# Patient Record
Sex: Female | Born: 1990 | Race: Black or African American | Hispanic: No | Marital: Single | Smoking: Former smoker
Health system: Southern US, Community
[De-identification: ages and names within clinical notes are randomized; demographics above are authoritative.]

## PROBLEM LIST (undated history)

## (undated) DIAGNOSIS — F909 Attention-deficit hyperactivity disorder, unspecified type: Secondary | ICD-10-CM

## (undated) DIAGNOSIS — A549 Gonococcal infection, unspecified: Secondary | ICD-10-CM

## (undated) HISTORY — PX: NO PAST SURGERIES: SHX2092

---

## 2018-02-04 ENCOUNTER — Other Ambulatory Visit: Payer: Self-pay

## 2018-02-04 ENCOUNTER — Emergency Department
Admission: EM | Admit: 2018-02-04 | Discharge: 2018-02-04 | Disposition: A | Discharge status: Home / Self Care | Payer: Self-pay | Attending: Emergency Medicine | Admitting: Emergency Medicine

## 2018-02-04 ENCOUNTER — Encounter: Payer: Self-pay | Admitting: Emergency Medicine

## 2018-02-04 DIAGNOSIS — K089 Disorder of teeth and supporting structures, unspecified: Secondary | ICD-10-CM

## 2018-02-04 DIAGNOSIS — G8929 Other chronic pain: Secondary | ICD-10-CM | POA: Insufficient documentation

## 2018-02-04 DIAGNOSIS — K0889 Other specified disorders of teeth and supporting structures: Secondary | ICD-10-CM | POA: Insufficient documentation

## 2018-02-04 DIAGNOSIS — F17210 Nicotine dependence, cigarettes, uncomplicated: Secondary | ICD-10-CM | POA: Insufficient documentation

## 2018-02-04 DIAGNOSIS — M542 Cervicalgia: Secondary | ICD-10-CM | POA: Insufficient documentation

## 2018-02-04 DIAGNOSIS — R1030 Lower abdominal pain, unspecified: Secondary | ICD-10-CM | POA: Insufficient documentation

## 2018-02-04 LAB — URINALYSIS, COMPLETE (UACMP) WITH MICROSCOPIC
Bacteria, UA: NONE SEEN
Bilirubin Urine: NEGATIVE
GLUCOSE, UA: NEGATIVE mg/dL
HGB URINE DIPSTICK: NEGATIVE
Ketones, ur: NEGATIVE mg/dL
Leukocytes, UA: NEGATIVE
NITRITE: NEGATIVE
PROTEIN: NEGATIVE mg/dL
Specific Gravity, Urine: 1.02 (ref 1.005–1.030)
pH: 6 (ref 5.0–8.0)

## 2018-02-04 LAB — POCT PREGNANCY, URINE: Preg Test, Ur: NEGATIVE

## 2018-02-04 MED ORDER — CEPHALEXIN 500 MG PO CAPS: 500.0000 mg | ORAL_CAPSULE | Freq: Four times a day (QID) | ORAL | 0 refills | Status: AC

## 2018-02-04 NOTE — ED Provider Notes (Signed)
[facility] Emergency Department Provider Note  ____________________________________________  Time seen: Approximately 4:53 PM  I have reviewed the triage vital signs and the nursing notes.   HISTORY  Chief Complaint Dental Pain and Neck Pain    HPI Jade Allen is a 27 y.o. female presents to the emergency department with multiple concerns including dental pain, neck pain and some suprapubic discomfort.  Patient is primarily here because she is unable to conceive.  Patient reports that she has been trying to conceive for the past 3 months.  Patient has been using a "semen retention lubrication" and has noticed suprapubic discomfort since using aforementioned product.  She denies dysuria, hematuria and increased urinary frequency.  Patient reports dental pain that is chronic in nature and patient is unable to seek care due to a lack of dental insurance.  Patient also reports other nonspecific musculoskeletal complaints due to her job at [employer].  No alleviating measures of been attempted   History reviewed. No pertinent past medical history.  There are no active problems to display for this patient.   History reviewed. No pertinent surgical history.  Prior to Admission medications   Medication Sig Start Date End Date Taking? Authorizing Provider  cephALEXin (KEFLEX) 500 MG capsule Take 1 capsule (500 mg total) by mouth 4 (four) times daily for 10 days. 02/04/18 02/14/18  Lannie Fields, PA-C    Allergies Patient has no known allergies.  No family history on file.  Social History Social History   Tobacco Use  . Smoking status: Current Every Day Smoker    Packs/day: 1.00    Types: Cigarettes  . Smokeless tobacco: Never Used  Substance Use Topics  . Alcohol use: Not on file  . Drug use: Not on file     Review of Systems  Constitutional: No fever/chills Eyes: No visual changes. No discharge ENT: No upper respiratory complaints. Cardiovascular: no  chest pain. Respiratory: no cough. No SOB. Gastrointestinal: No abdominal pain.  No nausea, no vomiting.  No diarrhea.  No constipation. Genitourinary: Negative for dysuria. No hematuria Musculoskeletal: See HPI Skin: Negative for rash, abrasions, lacerations, ecchymosis. Neurological: Negative for headaches, focal weakness or numbness.   ____________________________________________   PHYSICAL EXAM:  VITAL SIGNS: ED Triage Vitals  Enc Vitals Group     BP 02/04/18 1648 135/82     Pulse Rate 02/04/18 1648 78     Resp 02/04/18 1648 16     Temp --      Temp src --      SpO2 02/04/18 1648 99 %     Weight 02/04/18 1456 234 lb (106.1 kg)     Height 02/04/18 1456 5\' 3"  (1.6 m)     Head Circumference --      Peak Flow --      Pain Score 02/04/18 1648 7     Pain Loc --      Pain Edu? --      Excl. in [place]? --      Constitutional: Alert and oriented. Well appearing and in no acute distress. Eyes: Conjunctivae are normal. PERRL. EOMI. Head: Atraumatic. ENT:      Ears: TMs are pearly      Nose: No congestion/rhinnorhea.      Mouth/Throat: Mucous membranes are moist.  Multiple dental caries visualized. Neck: No stridor.  No cervical spine tenderness to palpation. Hematological/Lymphatic/Immunilogical: No cervical lymphadenopathy. Cardiovascular: Normal rate, regular rhythm. Normal S1 and S2.  Good peripheral circulation. Respiratory: Normal respiratory effort  without tachypnea or retractions. Lungs CTAB. Good air entry to the bases with no decreased or absent breath sounds. Multiple dental caries noted.  Gastrointestinal: Bowel sounds 4 quadrants. Soft and nontender to palpation. No guarding or rigidity. No palpable masses. No distention. No CVA tenderness. Musculoskeletal: Full range of motion to all extremities. No gross deformities appreciated. Neurologic:  Normal speech and language. No gross focal neurologic deficits are appreciated.  Skin:  Skin is warm, dry and intact. No rash  noted. Psychiatric: Mood and affect are normal. Speech and behavior are normal. Patient exhibits appropriate insight and judgement.   ____________________________________________   LABS (all labs ordered are listed, but only abnormal results are displayed)  Labs Reviewed  URINALYSIS, COMPLETE (UACMP) WITH MICROSCOPIC - Abnormal; Notable for the following components:      Result Value   Color, Urine YELLOW (*)    APPearance CLEAR (*)    Squamous Epithelial / LPF 0-5 (*)    All other components within normal limits  POC URINE PREG, ED  POCT PREGNANCY, URINE   ____________________________________________  EKG   ____________________________________________  RADIOLOGY   No results found.  ____________________________________________    PROCEDURES  Procedure(s) performed:    Procedures    Medications - No data to display   ____________________________________________   INITIAL IMPRESSION / ASSESSMENT AND PLAN / ED COURSE  Pertinent labs & imaging results that were available during my care of the patient were reviewed by me and considered in my medical decision making (see chart for details).  Review of the [place] CSRS was performed in accordance of the [place] prior to dispensing any controlled drugs.    Assessment and plan Chronic dental pain Suprapubic discomfort Neck pain Patient presents to the emergency department with multiple concerns.  Patient's primary concern is seeking medical advice in regards to achieving conception.  Patient was advised to quit smoking and patient education regarding tobacco cessation was given.  Patient was also advised to increase her physical activity, such as daily walking in the evenings.  Patient was advised to discontinue lubrication product discussed in HPI.  Patient education regarding ovulation was given.  Tylenol was recommended for discomfort and patient was advised to start taking prenatal vitamins daily.  All patient  questions were answered.   ____________________________________________  FINAL CLINICAL IMPRESSION(S) / ED DIAGNOSES  Final diagnoses:  Chronic dental pain      NEW MEDICATIONS STARTED DURING THIS VISIT:  ED Discharge Orders        Ordered    cephALEXin (KEFLEX) 500 MG capsule  4 times daily     02/04/18 1638          This chart was dictated using voice recognition software/Dragon. Despite best efforts to proofread, errors can occur which can change the meaning. Any change was purely unintentional.    Lannie Fields, PA-C 02/04/18 1700    Delman Kitten, MD 02/07/18 0100

## 2018-02-04 NOTE — ED Triage Notes (Addendum)
Pt arrived via POV from work, pt states she has been having dental pain and neck pain for several weeks, pt also states she has been trying to have a baby, states last menstral cycle was 3/19.  Pt denies any vaginal discharge-states [facility] just [facility] about a [facility] ago for pelvic exam.   Pt states she feels pressure around suprapubic area that started during period.  Pt states she has had unprotected sex.

## 2018-02-04 NOTE — Discharge Instructions (Signed)
OPTIONS FOR DENTAL FOLLOW UP CARE ° °[place] Department of Health and Human Services - Local Safety Net Dental Clinics °http://www.ncdhhs.gov/dph/oralhealth/services/safetynetclinics.htm °  °[facility] (336-562-3123) ° °[facility] [place] (919-933-9087) ° °[facility] ([phone] ext [phone]) ° °[facility] ([phone]) ° °[facility] ([phone]) °This clinic caters to the indigent population and is on a lottery system. °Location: °[facility], [address], [place] °Clinic Hours: °Wednesdays from 6pm - 9pm, patients seen by a lottery system. °For dates, call or go to [email or web address].[facility].edu/[facility]/patients/Dental-[facility] °Services: °Cleanings, fillings and simple extractions. °Payment Options: °DENTAL WORK IS FREE OF CHARGE. Bring proof of income or support. °Best way to get seen: °Arrive at 5:15 pm - this is a lottery, NOT first come/first serve, so arriving earlier will not increase your chances of being seen. °  °  °[facility] °[phone] °Select option 1 for emergencies °  °Location: °[facility], [address], [place] °Clinic Hours: °No walk-ins accepted - call the day before to schedule an appointment. °Check in times are 9:30 am and 1:30 pm. °Services: °Simple extractions, temporary fillings, pulpectomy/pulp debridement, uncomplicated abscess drainage. °Payment Options: °PAYMENT IS DUE AT THE TIME OF SERVICE.  Fee is usually $100-200, additional surgical procedures (e.g. abscess drainage) may be extra. °Cash, checks, Visa/MasterCard accepted.  Can file Medicaid if patient is covered for dental - patient should call case worker to check. °No discount for [facility] patients. °Best way to get seen: °MUST call the day before and get onto the schedule. Can usually be seen the next 1-2 days. No walk-ins accepted. °  °  °[facility] °[phone] °   °Location: °[facility], [address], [place] °Clinic Hours: °M, W, Th, F 8am or 1:30pm, Tues 9a or 1:30 - first come/first served. °Services: °Simple extractions, temporary fillings, uncomplicated abscess drainage.  You do not need to be an [place] resident. °Payment Options: °PAYMENT IS DUE AT THE TIME OF SERVICE. °Dental insurance, otherwise sliding scale - bring proof of income or support. °Depending on income and treatment needed, cost is usually $50-200. °Best way to get seen: °Arrive early as it is first come/first served. °  °  °[facility] °[phone] °  °Location: °[address] °Clinic Hours: °Mon-Thu 8a-5p °Services: °Most basic dental services including extractions and fillings. °Payment Options: °PAYMENT IS DUE AT THE TIME OF SERVICE. °Sliding scale, up to 50% off - bring proof if income or support. °Medicaid with dental option accepted. °Best way to get seen: °Call to schedule an appointment, can usually be seen within 2 weeks OR they will try to see walk-ins - show up at 8a or 2p (you may have to wait). °  °  °[facility] °919-245-2435 °[place] RESIDENTS ONLY °  °Location: °[facility], [address], [place],  [address] °Clinic Hours: By appointment only. °Monday - Thursday 8am-5pm, Friday 8am-12pm °Services: Cleanings, fillings, extractions. °Payment Options: °PAYMENT IS DUE AT THE TIME OF SERVICE. °Cash, Visa or MasterCard. Sliding scale - $30 minimum per service. °Best way to get seen: °Come in to office, complete packet and make an appointment - need proof of income °or support monies for each household member and proof of [place] residence. °Usually takes about a month to get in. °  °  °[facility] °[phone] °  °Location: °[address]   [place] °Clinic Hours: Walk-in Urgent Care Dental Services are offered Monday-Friday  mornings only. °The numbers of emergencies accepted daily is limited to the number of °providers available. °Maximum 15 - Mondays, Wednesdays & Thursdays °Maximum 10 - Tuesdays & Fridays °Services: °You do not need to be a [place] resident to be seen for a dental emergency. °Emergencies are defined as pain, swelling, abnormal bleeding, or dental trauma. Walkins will receive x-rays if needed. °NOTE: Dental cleaning is not an emergency. °Payment Options: °PAYMENT IS DUE AT THE TIME OF SERVICE. °Minimum co-pay is $40.00 for uninsured patients. °Minimum co-pay is $3.00 for Medicaid with dental coverage. °Dental Insurance is accepted and must be presented at time of visit. °Medicare does not cover dental. °Forms of payment: Cash, credit card, checks. °Best way to get seen: °If not previously registered with the clinic, walk-in dental registration begins at 7:15 am and is on a first come/first serve basis. °If previously registered with the clinic, call to make an appointment. °  °  °[facility] °[phone] °[place] RESIDENTS ONLY °  °Location: °[address], [place], [place] °Clinic Hours: °Mon-Thu 10a-2p °Services: Extractions only! °Payment Options: °FREE (donations accepted) - bring proof of income or support °Best way to get seen: °Call and schedule an appointment OR come at 8am on the 1st Monday of every month (except for holidays) when it is first come/first served. °  °  °[facility] °[phone] °  °Location: °[address], [place] °Clinic Hours: °Friday mornings °Services, Payment Options, Best way to get seen: °Call for info °

## 2018-04-27 ENCOUNTER — Emergency Department
Admission: EM | Admit: 2018-04-27 | Discharge: 2018-04-27 | Disposition: A | Discharge status: Home / Self Care | Payer: Self-pay | Attending: Emergency Medicine | Admitting: Emergency Medicine

## 2018-04-27 DIAGNOSIS — S161XXA Strain of muscle, fascia and tendon at neck level, initial encounter: Secondary | ICD-10-CM | POA: Insufficient documentation

## 2018-04-27 DIAGNOSIS — F17210 Nicotine dependence, cigarettes, uncomplicated: Secondary | ICD-10-CM | POA: Insufficient documentation

## 2018-04-27 DIAGNOSIS — Y990 Civilian activity done for income or pay: Secondary | ICD-10-CM | POA: Insufficient documentation

## 2018-04-27 DIAGNOSIS — X500XXA Overexertion from strenuous movement or load, initial encounter: Secondary | ICD-10-CM | POA: Insufficient documentation

## 2018-04-27 DIAGNOSIS — S46911A Strain of unspecified muscle, fascia and tendon at shoulder and upper arm level, right arm, initial encounter: Secondary | ICD-10-CM | POA: Insufficient documentation

## 2018-04-27 DIAGNOSIS — T148XXA Other injury of unspecified body region, initial encounter: Secondary | ICD-10-CM

## 2018-04-27 DIAGNOSIS — Y929 Unspecified place or not applicable: Secondary | ICD-10-CM | POA: Insufficient documentation

## 2018-04-27 DIAGNOSIS — Y9389 Activity, other specified: Secondary | ICD-10-CM | POA: Insufficient documentation

## 2018-04-27 MED ORDER — CYCLOBENZAPRINE HCL 10 MG PO TABS
5.0000 mg | ORAL_TABLET | Freq: Once | ORAL | Status: AC
  Administered 2018-04-27: 5 mg via ORAL
  Filled 2018-04-27: qty 1

## 2018-04-27 MED ORDER — CYCLOBENZAPRINE HCL 5 MG PO TABS: 5.0000 mg | ORAL_TABLET | Freq: Three times a day (TID) | ORAL | 0 refills | Status: AC | PRN

## 2018-04-27 MED ORDER — KETOROLAC TROMETHAMINE 10 MG PO TABS: 10.0000 mg | ORAL_TABLET | Freq: Four times a day (QID) | ORAL | 0 refills | Status: DC | PRN

## 2018-04-27 MED ORDER — KETOROLAC TROMETHAMINE 30 MG/ML IJ SOLN
30.0000 mg | Freq: Once | INTRAMUSCULAR | Status: AC
  Administered 2018-04-27: 30 mg via INTRAMUSCULAR
  Filled 2018-04-27: qty 1

## 2018-04-27 NOTE — ED Triage Notes (Signed)
Patient c/o neck pain and upper back pain. Patient reports hx of the same. Patient reports she has been prescribed lidocaine patches in the past with no relief.

## 2018-04-27 NOTE — ED Provider Notes (Signed)
[facility] Emergency Department Provider Note  ____________________________________________  Time seen: Approximately 8:42 PM  I have reviewed the triage vital signs and the nursing notes.   HISTORY  Chief Complaint Neck Pain and Back Pain    HPI Jade Allen is a 27 y.o. female that presents to the emergency department for evaluation of right neck and shoulder pain for 2 days.  Pain is worse with rotating neck and moving right shoulder.  She lifts boxes for work.  No trauma.  She has been evaluated for this in the past and has been given a Lidoderm patch which does not help.  No headache, SOB, CP, numbness, tingling.  History reviewed. No pertinent past medical history.  There are no active problems to display for this patient.   History reviewed. No pertinent surgical history.  Prior to Admission medications   Medication Sig Start Date End Date Taking? Authorizing Provider  cyclobenzaprine (FLEXERIL) 5 MG tablet Take 1 tablet (5 mg total) by mouth 3 (three) times daily as needed for up to 7 days for muscle spasms. 04/27/18 05/04/18  Laban Emperor, PA-C  ketorolac (TORADOL) 10 MG tablet Take 1 tablet (10 mg total) by mouth every 6 (six) hours as needed. 04/27/18   Laban Emperor, PA-C    Allergies Patient has no known allergies.  No family history on file.  Social History Social History   Tobacco Use  . Smoking status: Current Every Day Smoker    Packs/day: 1.00    Types: Cigarettes  . Smokeless tobacco: Never Used  Substance Use Topics  . Alcohol use: Yes  . Drug use: Not on file     Review of Systems  Constitutional: No fever/chills ENT: No upper respiratory complaints. Cardiovascular: No chest pain. Respiratory: No SOB. Gastrointestinal: No abdominal pain.  No nausea, no vomiting.  Musculoskeletal: Positive for neck and shoulder pain.  Skin: Negative for rash, abrasions, lacerations, ecchymosis. Neurological: Negative for headaches,  numbness or tingling   ____________________________________________   PHYSICAL EXAM:  VITAL SIGNS: ED Triage Vitals  Enc Vitals Group     BP 04/27/18 1938 125/79     Pulse Rate 04/27/18 1938 63     Resp 04/27/18 1938 18     Temp 04/27/18 1938 98.4 F (36.9 C)     Temp Source 04/27/18 1938 Oral     SpO2 04/27/18 1938 99 %     Weight 04/27/18 1937 230 lb (104.3 kg)     Height 04/27/18 1937 5\' 3"  (1.6 m)     Head Circumference --      Peak Flow --      Pain Score 04/27/18 1937 9     Pain Loc --      Pain Edu? --      Excl. in [place]? --      Constitutional: Alert and oriented. Well appearing and in no acute distress. Eyes: Conjunctivae are normal. PERRL. EOMI. Head: Atraumatic. ENT:      Ears:      Nose: No congestion/rhinnorhea.      Mouth/Throat: Mucous membranes are moist.  Neck: No stridor. No cervical spine tenderness to palpation.  Full range of motion of neck.  Cardiovascular: Normal rate, regular rhythm.  Good peripheral circulation. Symmetric radial pulses. Respiratory: Normal respiratory effort without tachypnea or retractions. Lungs CTAB. Good air entry to the bases with no decreased or absent breath sounds. Musculoskeletal: Full range of motion to all extremities. No gross deformities appreciated.  Tenderness to palpation over right  trapezius muscle.  Strength and sensation equal in upper extremities bilaterally.  Neurologic:  Normal speech and language. No gross focal neurologic deficits are appreciated.  Skin:  Skin is warm, dry and intact. No rash noted. Psychiatric: Mood and affect are normal. Speech and behavior are normal. Patient exhibits appropriate insight and judgement.   ____________________________________________   LABS (all labs ordered are listed, but only abnormal results are displayed)  Labs Reviewed - No data to display ____________________________________________  EKG   ____________________________________________  RADIOLOGY   No  results found.  ____________________________________________    PROCEDURES  Procedure(s) performed:    Procedures    Medications  ketorolac (TORADOL) 30 MG/ML injection 30 mg (30 mg Intramuscular Given 04/27/18 2055)  cyclobenzaprine (FLEXERIL) tablet 5 mg (5 mg Oral Given 04/27/18 2056)     ____________________________________________   INITIAL IMPRESSION / ASSESSMENT AND PLAN / ED COURSE  Pertinent labs & imaging results that were available during my care of the patient were reviewed by me and considered in my medical decision making (see chart for details).  Review of the [place] CSRS was performed in accordance of the [place] prior to dispensing any controlled drugs.   Patient's diagnosis is consistent with muscle strain.  Vital signs and exam are reassuring.  IM Toradol and oral Flexeril were given in ED.  Patient will be discharged home with prescriptions for toradol and flexeril. Patient is to follow up with PCP as directed. Patient is given ED precautions to return to the ED for any worsening or new symptoms.     ____________________________________________  FINAL CLINICAL IMPRESSION(S) / ED DIAGNOSES  Final diagnoses:  Muscle strain      NEW MEDICATIONS STARTED DURING THIS VISIT:  ED Discharge Orders        Ordered    cyclobenzaprine (FLEXERIL) 5 MG tablet  3 times daily PRN     04/27/18 2117    ketorolac (TORADOL) 10 MG tablet  Every 6 hours PRN     04/27/18 2117          This chart was dictated using voice recognition software/Dragon. Despite best efforts to proofread, errors can occur which can change the meaning. Any change was purely unintentional.    Laban Emperor, PA-C 04/27/18 2214    Arta Silence, MD 04/28/18 [phone]

## 2018-04-27 NOTE — ED Notes (Signed)
Pt reports having left side neck, shoulder and upper back pain (only on the left side) since yesterday. Pt reports that it is tender to touch and symptoms are aggravated during LUE movement and when she turns her head from side to side. No neck stiffness was noted, pt denies headache or throat pain. Pt does not show any signs of respiratory distress. We will continue to monitor the pt.

## 2018-05-14 ENCOUNTER — Emergency Department
Admission: EM | Admit: 2018-05-14 | Discharge: 2018-05-14 | Disposition: A | Discharge status: Home / Self Care | Payer: Medicaid Other | Attending: Emergency Medicine | Admitting: Emergency Medicine

## 2018-05-14 ENCOUNTER — Other Ambulatory Visit: Payer: Self-pay

## 2018-05-14 ENCOUNTER — Encounter: Payer: Self-pay | Admitting: Emergency Medicine

## 2018-05-14 DIAGNOSIS — Z79899 Other long term (current) drug therapy: Secondary | ICD-10-CM | POA: Insufficient documentation

## 2018-05-14 DIAGNOSIS — F17210 Nicotine dependence, cigarettes, uncomplicated: Secondary | ICD-10-CM | POA: Insufficient documentation

## 2018-05-14 DIAGNOSIS — K0889 Other specified disorders of teeth and supporting structures: Secondary | ICD-10-CM | POA: Insufficient documentation

## 2018-05-14 MED ORDER — AMOXICILLIN 500 MG PO CAPS: 500.0000 mg | ORAL_CAPSULE | Freq: Three times a day (TID) | ORAL | 0 refills | Status: DC

## 2018-05-14 MED ORDER — LIDOCAINE VISCOUS HCL 2 % MT SOLN: 10.0000 mL | OROMUCOSAL | 0 refills | Status: DC | PRN

## 2018-05-14 MED ORDER — TRAMADOL HCL 50 MG PO TABS
50.0000 mg | ORAL_TABLET | Freq: Once | ORAL | Status: AC
  Administered 2018-05-14: 50 mg via ORAL
  Filled 2018-05-14: qty 1

## 2018-05-14 NOTE — ED Notes (Signed)
Right lower wisdom tooth only partially out. Swollen, tender.

## 2018-05-14 NOTE — ED Triage Notes (Addendum)
Pt c/o right side dental pain 2-3 days using ibuprofen, tylenol and oragel with no relief.

## 2018-05-14 NOTE — ED Provider Notes (Signed)
[facility] Emergency Department Provider Note  ____________________________________________  Time seen: Approximately 8:02 AM  I have reviewed the triage vital signs and the nursing notes.   HISTORY  Chief Complaint Dental Pain    HPI Jade Allen is a 27 y.o. female presents emergency department for evaluation of right bottom dental pain for 3 days.  Patient states that this tooth has bothered her on and off for a while.  She has not seen a dentist.  No fever, chills, difficulty opening and closing mouth, swelling, drainage from mouth, nausea, vomiting.   History reviewed. No pertinent past medical history.  There are no active problems to display for this patient.   No past surgical history on file.  Prior to Admission medications   Medication Sig Start Date End Date Taking? Authorizing Provider  amoxicillin (AMOXIL) 500 MG capsule Take 1 capsule (500 mg total) by mouth 3 (three) times daily. 05/14/18   Laban Emperor, PA-C  ketorolac (TORADOL) 10 MG tablet Take 1 tablet (10 mg total) by mouth every 6 (six) hours as needed. 04/27/18   Laban Emperor, PA-C  lidocaine (XYLOCAINE) 2 % solution Use as directed 10 mLs in the mouth or throat as needed for mouth pain. 05/14/18   Laban Emperor, PA-C    Allergies Patient has no known allergies.  No family history on file.  Social History Social History   Tobacco Use  . Smoking status: Current Every Day Smoker    Packs/day: 1.00    Types: Cigarettes  . Smokeless tobacco: Never Used  Substance Use Topics  . Alcohol use: Yes  . Drug use: Not on file     Review of Systems  Constitutional: No fever/chills Respiratory: No SOB. Gastrointestinal: No abdominal pain.  No nausea, no vomiting.  Musculoskeletal: Negative for musculoskeletal pain. Skin: Negative for rash, abrasions, lacerations, ecchymosis. Neurological: Negative for headaches   ____________________________________________   PHYSICAL  EXAM:  VITAL SIGNS: ED Triage Vitals [05/14/18 0742]  Enc Vitals Group     BP (!) 116/56     Pulse Rate 66     Resp 16     Temp 98.4 F (36.9 C)     Temp Source Oral     SpO2 98 %     Weight 230 lb (104.3 kg)     Height 5\' 3"  (1.6 m)     Head Circumference      Peak Flow      Pain Score 10     Pain Loc      Pain Edu?      Excl. in [place]?      Constitutional: Alert and oriented. Well appearing and in no acute distress. Eyes: Conjunctivae are normal. PERRL. EOMI. Head: Atraumatic. ENT:      Ears:      Nose: No congestion/rhinnorhea.       Mouth/Throat: Mucous membranes are moist.  Partially erupted bottom right wisdom tooth with surrounding tenderness to palpation.  No swelling. Neck: No stridor.  Cardiovascular: Normal rate, regular rhythm.  Good peripheral circulation. Respiratory: Normal respiratory effort without tachypnea or retractions. Lungs CTAB. Good air entry to the bases with no decreased or absent breath sounds. Musculoskeletal: Full range of motion to all extremities. No gross deformities appreciated. Neurologic:  Normal speech and language. No gross focal neurologic deficits are appreciated.  Skin:  Skin is warm, dry and intact. No rash noted. Psychiatric: Mood and affect are normal. Speech and behavior are normal. Patient exhibits appropriate insight and judgement.  ____________________________________________   LABS (all labs ordered are listed, but only abnormal results are displayed)  Labs Reviewed - No data to display ____________________________________________  EKG   ____________________________________________  RADIOLOGY  No results found.  ____________________________________________    PROCEDURES  Procedure(s) performed:    Procedures    Medications  traMADol (ULTRAM) tablet 50 mg (50 mg Oral Given 05/14/18 0807)     ____________________________________________   INITIAL IMPRESSION / ASSESSMENT AND PLAN / ED  COURSE  Pertinent labs & imaging results that were available during my care of the patient were reviewed by me and considered in my medical decision making (see chart for details).  Review of the [place] CSRS was performed in accordance of the [place] prior to dispensing any controlled drugs.     Patient presented to the emergency department for evaluation of dental pain for 3 days.  Patient has interrupting wisdom tooth.  Dental resources were provided.  Patient will be discharged home with prescriptions for amoxicillin and viscous lidocaine. Patient is to follow up with dentist as directed. Patient is given ED precautions to return to the ED for any worsening or new symptoms.     ____________________________________________  FINAL CLINICAL IMPRESSION(S) / ED DIAGNOSES  Final diagnoses:  Dentalgia      NEW MEDICATIONS STARTED DURING THIS VISIT:  ED Discharge Orders        Ordered    amoxicillin (AMOXIL) 500 MG capsule  3 times daily     05/14/18 0801    lidocaine (XYLOCAINE) 2 % solution  As needed     05/14/18 0801          This chart was dictated using voice recognition software/Dragon. Despite best efforts to proofread, errors can occur which can change the meaning. Any change was purely unintentional.    Laban Emperor, PA-C 05/14/18 1008    Lavonia Drafts, MD 05/14/18 1324

## 2018-05-14 NOTE — Discharge Instructions (Signed)
OPTIONS FOR DENTAL FOLLOW UP CARE ° °[place] Department of Health and Human Services - Local Safety Net Dental Clinics °http://www.ncdhhs.gov/dph/oralhealth/services/safetynetclinics.htm °  °[facility] (336-562-3123) ° °[facility] ([phone]) ° °[facility] ([phone] ext [phone]) ° °[facility] ([phone]) ° °[facility] ([phone]) °This clinic caters to the indigent population and is on a lottery system. °Location: °[facility], [address], [place] °Clinic Hours: °Wednesdays from 6pm - 9pm, patients seen by a lottery system. °For dates, call or go to [email or web address]/[facility]/patients/[email or web address]-[facility] °Services: °Cleanings, fillings and simple extractions. °Payment Options: °DENTAL WORK IS FREE OF CHARGE. Bring proof of income or support. °Best way to get seen: °Arrive at 5:15 pm - this is a lottery, NOT first come/first serve, so arriving earlier will not increase your chances of being seen. °  °  °[facility] °[phone] °Select option 1 for emergencies °  °Location: °[facility], [address], [place] °Clinic Hours: °No walk-ins accepted - call the day before to schedule an appointment. °Check in times are 9:30 am and 1:30 pm. °Services: °Simple extractions, temporary fillings, pulpectomy/pulp debridement, uncomplicated abscess drainage. °Payment Options: °PAYMENT IS DUE AT THE TIME OF SERVICE.  Fee is usually $100-200, additional surgical procedures (e.g. abscess drainage) may be extra. °Cash, checks, Visa/MasterCard accepted.  Can file Medicaid if patient is covered for dental - patient should call case worker to check. °No discount for [facility] patients. °Best way to get seen: °MUST call the day before and get onto the schedule. Can usually be seen the next 1-2 days. No walk-ins accepted. °  °  °[facility] °[phone] °   °Location: °[facility], [address], [place] °Clinic Hours: °M, W, Th, F 8am or 1:30pm, Tues 9a or 1:30 - first come/first served. °Services: °Simple extractions, temporary fillings, uncomplicated abscess drainage.  You do not need to be an [place] resident. °Payment Options: °PAYMENT IS DUE AT THE TIME OF SERVICE. °Dental insurance, otherwise sliding scale - bring proof of income or support. °Depending on income and treatment needed, cost is usually $50-200. °Best way to get seen: °Arrive early as it is first come/first served. °  °  °[facility] °[phone] °  °Location: °[address] °Clinic Hours: °Mon-Thu 8a-5p °Services: °Most basic dental services including extractions and fillings. °Payment Options: °PAYMENT IS DUE AT THE TIME OF SERVICE. °Sliding scale, up to 50% off - bring proof if income or support. °Medicaid with dental option accepted. °Best way to get seen: °Call to schedule an appointment, can usually be seen within 2 weeks OR they will try to see walk-ins - show up at 8a or 2p (you may have to wait). °  °  °[facility] °[phone] °[place] RESIDENTS ONLY °  °Location: °[facility], [address], [place], [place] [address] °Clinic Hours: By appointment only. °Monday - Thursday 8am-5pm, Friday 8am-12pm °Services: Cleanings, fillings, extractions. °Payment Options: °PAYMENT IS DUE AT THE TIME OF SERVICE. °Cash, Visa or MasterCard. Sliding scale - $30 minimum per service. °Best way to get seen: °Come in to office, complete packet and make an appointment - need proof of income °or support monies for each household member and proof of [place] residence. °Usually takes about a month to get in. °  °  °[facility] °[phone] °  °Location: °[address]   [place] °Clinic Hours: Walk-in Urgent Care Dental Services are offered Monday-Friday  mornings only. °The numbers of emergencies accepted daily is limited to the number of °providers available. °Maximum 15 - Mondays, Wednesdays & Thursdays °Maximum 10 - Tuesdays & Fridays °Services: °You do not need to be a [place] resident to be seen for a dental emergency. °Emergencies are defined as pain, swelling, abnormal bleeding, or dental trauma. Walkins will receive x-rays if needed. °NOTE: Dental cleaning is not an emergency. °Payment Options: °PAYMENT IS DUE AT THE TIME OF SERVICE. °Minimum co-pay is $40.00 for uninsured patients. °Minimum co-pay is $3.00 for Medicaid with dental coverage. °Dental Insurance is accepted and must be presented at time of visit. °Medicare does not cover dental. °Forms of payment: Cash, credit card, checks. °Best way to get seen: °If not previously registered with the clinic, walk-in dental registration begins at 7:15 am and is on a first come/first serve basis. °If previously registered with the clinic, call to make an appointment. °  °  °[facility] °[phone] °[place] RESIDENTS ONLY °  °Location: °[address], [place], [place] °Clinic Hours: °Mon-Thu 10a-2p °Services: Extractions only! °Payment Options: °FREE (donations accepted) - bring proof of income or support °Best way to get seen: °Call and schedule an appointment OR come at 8am on the 1st Monday of every month (except for holidays) when it is first come/first served. °  °  °[facility] °[phone] °  °Location: °[address], [place] °Clinic Hours: °Friday mornings °Services, Payment Options, Best way to get seen: °Call for info °

## 2018-10-13 ENCOUNTER — Other Ambulatory Visit: Payer: Self-pay

## 2018-10-13 ENCOUNTER — Emergency Department
Admission: EM | Admit: 2018-10-13 | Discharge: 2018-10-13 | Disposition: A | Discharge status: Home / Self Care | Payer: Medicaid Other | Attending: Emergency Medicine | Admitting: Emergency Medicine

## 2018-10-13 ENCOUNTER — Encounter: Payer: Self-pay | Admitting: Emergency Medicine

## 2018-10-13 DIAGNOSIS — A549 Gonococcal infection, unspecified: Secondary | ICD-10-CM | POA: Insufficient documentation

## 2018-10-13 DIAGNOSIS — A5901 Trichomonal vulvovaginitis: Secondary | ICD-10-CM | POA: Insufficient documentation

## 2018-10-13 DIAGNOSIS — A599 Trichomoniasis, unspecified: Secondary | ICD-10-CM | POA: Insufficient documentation

## 2018-10-13 DIAGNOSIS — F17210 Nicotine dependence, cigarettes, uncomplicated: Secondary | ICD-10-CM | POA: Insufficient documentation

## 2018-10-13 LAB — CHLAMYDIA/NGC RT PCR (ARMC ONLY)
Chlamydia Tr: NOT DETECTED
N gonorrhoeae: DETECTED — AB

## 2018-10-13 LAB — URINALYSIS, COMPLETE (UACMP) WITH MICROSCOPIC
Bacteria, UA: NONE SEEN
Bilirubin Urine: NEGATIVE
Glucose, UA: NEGATIVE mg/dL
Hgb urine dipstick: NEGATIVE
Ketones, ur: NEGATIVE mg/dL
Leukocytes, UA: NEGATIVE
NITRITE: NEGATIVE
PROTEIN: NEGATIVE mg/dL
Specific Gravity, Urine: 1.021 (ref 1.005–1.030)
pH: 6 (ref 5.0–8.0)

## 2018-10-13 LAB — POCT PREGNANCY, URINE: PREG TEST UR: NEGATIVE

## 2018-10-13 LAB — WET PREP, GENITAL
CLUE CELLS WET PREP: NONE SEEN
Sperm: NONE SEEN
Yeast Wet Prep HPF POC: NONE SEEN

## 2018-10-13 MED ORDER — AZITHROMYCIN 500 MG PO TABS
1000.0000 mg | ORAL_TABLET | Freq: Once | ORAL | Status: AC
  Administered 2018-10-13: 1000 mg via ORAL
  Filled 2018-10-13: qty 2

## 2018-10-13 MED ORDER — METRONIDAZOLE 500 MG PO TABS: 500.0000 mg | ORAL_TABLET | Freq: Two times a day (BID) | ORAL | 0 refills | Status: DC

## 2018-10-13 MED ORDER — CEFTRIAXONE SODIUM 250 MG IJ SOLR
250.0000 mg | Freq: Once | INTRAMUSCULAR | Status: AC
  Administered 2018-10-13: 250 mg via INTRAMUSCULAR
  Filled 2018-10-13: qty 250

## 2018-10-13 NOTE — ED Notes (Signed)
Called patient with std results--positive for gonorrhea.  I explained that she needed an injection.  I asked her to call the [facility] STD clinic to see if they can treat here today.  And if they cannot see her she should return here for treatment.

## 2018-10-13 NOTE — ED Provider Notes (Signed)
[facility] Emergency Department Provider Note  ____________________________________________   First MD Initiated Contact with Patient 10/13/18 [phone]     (approximate)  I have reviewed the triage vital signs and the nursing notes.   HISTORY  Chief Complaint Exposure to STD   HPI Jade Allen is a 27 y.o. female patient presents to the ED with complaint of possible STD.  Patient denies any symptoms and states that she got a text message from someone stating that he was exposed to an STD.  He did not answer when she wanted to know what specific STD.  Patient states that this is not a regular relationship and she is sexually active with her boyfriend and that he is not having any issues.  Patient denies any abdominal pain, vaginal discharge, dysuria, fever or chills.  Patient states that she goes to the health department every 3 months to have STD screening.  She wants to prove to the other person that she does not have anything.  History reviewed. No pertinent past medical history.  There are no active problems to display for this patient.   History reviewed. No pertinent surgical history.  Prior to Admission medications   Medication Sig Start Date End Date Taking? Authorizing Provider  metroNIDAZOLE (FLAGYL) 500 MG tablet Take 1 tablet (500 mg total) by mouth 2 (two) times daily. 10/13/18   Johnn Hai, PA-C    Allergies Patient has no known allergies.  No family history on file.  Social History Social History   Tobacco Use  . Smoking status: Current Every Day Smoker    Packs/day: 1.00    Types: Cigarettes  . Smokeless tobacco: Never Used  Substance Use Topics  . Alcohol use: Yes  . Drug use: Not on file    Review of Systems Constitutional: No fever/chills Cardiovascular: Denies chest pain. Respiratory: Denies shortness of breath. Gastrointestinal: No abdominal pain.  No nausea, no vomiting.  Genitourinary: Negative for dysuria.   Negative for vaginal discomfort or discharge. Musculoskeletal: Negative for muscle aches. Skin: Negative for rash. Neurological: Negative for headaches, focal weakness or numbness. ___________________________________________   PHYSICAL EXAM:  VITAL SIGNS: ED Triage Vitals [10/13/18 0919]  Enc Vitals Group     BP (!) 146/79     Pulse Rate 84     Resp 18     Temp 98.6 F (37 C)     Temp Source Oral     SpO2 100 %     Weight 228 lb (103.4 kg)     Height 5\' 3"  (1.6 m)     Head Circumference      Peak Flow      Pain Score 0     Pain Loc      Pain Edu?      Excl. in [place]?    Constitutional: Alert and oriented. Well appearing and in no acute distress. Eyes: Conjunctivae are normal.  Head: Atraumatic. Neck: No stridor.   Cardiovascular: Normal rate, regular rhythm. Grossly normal heart sounds.  Good peripheral circulation. Respiratory: Normal respiratory effort.  No retractions. Lungs CTAB. Gastrointestinal: Soft and nontender. No distention.  Obese. Genitourinary: External genitalia there is no remarkable rash or vesicles present.  On vaginal exam there is some minimal white mucus present, no discoloration.  No adnexal masses or tenderness noted.  No cervical motion tenderness present. Musculoskeletal: Moves upper and lower extremities with any difficulty normal gait was noted. Neurologic:  Normal speech and language. No gross focal neurologic deficits are  appreciated.  Skin:  Skin is warm, dry and intact. No rash noted. Psychiatric: Mood and affect are normal. Speech and behavior are normal.  ____________________________________________   LABS (all labs ordered are listed, but only abnormal results are displayed)  Labs Reviewed  WET PREP, GENITAL - Abnormal; Notable for the following components:      Result Value   Trich, Wet Prep PRESENT (*)    WBC, Wet Prep HPF POC MODERATE (*)    All other components within normal limits  CHLAMYDIA/NGC RT PCR ([facility] ONLY) -  Abnormal; Notable for the following components:   N gonorrhoeae DETECTED (*)    All other components within normal limits  URINALYSIS, COMPLETE (UACMP) WITH MICROSCOPIC - Abnormal; Notable for the following components:   Color, Urine YELLOW (*)    APPearance CLEAR (*)    All other components within normal limits  POC URINE PREG, ED  POCT PREGNANCY, URINE    PROCEDURES  Procedure(s) performed: None  Procedures  Critical Care performed: No  ____________________________________________   INITIAL IMPRESSION / ASSESSMENT AND PLAN / ED COURSE  As part of my medical decision making, I reviewed the following data within the electronic MEDICAL RECORD NUMBER Notes from prior ED visits and [place] Controlled Substance Database  Patient presents to the ED with complaint of being told that she has an STD.  Patient denies any symptoms and her current boyfriend of almost 3 weeks is also not having any symptoms.  Patient denies any symptoms.  Patient requested that test be done here and she will follow-up with the health department.  Vaginal exam was unremarkable and patient had no tenderness on palpation.  Wet prep did show trichomonas and patient was made aware.  She was placed on Flagyl and given instructions about safe sex.  She was notified that she will be called if any test results show otherwise.  ----------------------------------------- 3:30 PM on 10/13/2018 ----------------------------------------- Patient was called by [name], RN and notified that her test results for gonorrhea was positive.  Patient is going to try to call the health department and be treated there today.  She is aware that if they are unable to see her that she will be coming to the ED for treatment.  ____________________________________________   FINAL CLINICAL IMPRESSION(S) / ED DIAGNOSES  Final diagnoses:  Trichomonas vaginitis  Gonorrhea in female     ED Discharge Orders         Ordered    metroNIDAZOLE  (FLAGYL) 500 MG tablet  2 times daily     10/13/18 1331           Note:  This document was prepared using Dragon voice recognition software and may include unintentional dictation errors.    Johnn Hai, PA-C 10/13/18 1532    Nena Polio, MD 10/13/18 [phone]

## 2018-10-13 NOTE — ED Notes (Signed)
Will defer physical exam to PA.

## 2018-10-13 NOTE — Discharge Instructions (Signed)
Follow-up with the health department if any continued problems.  Make sure that all sexual partners are treated.  No intercourse for 7 days.  Practice safe sex.

## 2018-10-13 NOTE — ED Notes (Signed)
Pelvic by Suanne Marker PA.  Gc/chlam swab and wet prep to lab.

## 2018-10-13 NOTE — Discharge Instructions (Addendum)
Follow-up with the health department for the STD clinic if any continued issues. Began Flagyl 500 mg twice daily for 7 days.   Have sexual partner tested for trichomonas.  No unprotected sex until all partners have been treated. No alcohol while taking the Flagyl as it will cause you to become extremely sick.

## 2018-10-13 NOTE — ED Triage Notes (Signed)
Patient reports that she got a text 2 days ago from a sexual partner that he had an STD and she needed to be tested. Patient states he will not tell her what he has. Patient denies any discharge, itching or other symptoms.

## 2018-10-13 NOTE — ED Notes (Signed)
Patient says she wants to go home and get results later.  Says she has to go to work.

## 2018-10-13 NOTE — ED Provider Notes (Signed)
[facility] Emergency Department Provider Note  ___________________________________________   First MD Initiated Contact with Patient 10/13/18 1638     (approximate)  I have reviewed the triage vital signs and the nursing notes.   HISTORY  Chief Complaint No chief complaint on file.    HPI Jade Allen is a 27 y.o. female patient presents to the ED after being called by ED reporting to her that her gonorrhea test was positive.  She was unable to be seen at the health department's afternoon and returns to the ED for medication.  She was seen by this provider earlier in the day and tested positive for trichomonas as well.   History reviewed. No pertinent past medical history.  There are no active problems to display for this patient.   History reviewed. No pertinent surgical history.  Prior to Admission medications   Medication Sig Start Date End Date Taking? Authorizing Provider  metroNIDAZOLE (FLAGYL) 500 MG tablet Take 1 tablet (500 mg total) by mouth 2 (two) times daily. 10/13/18   Johnn Hai, PA-C    Allergies Patient has no known allergies.  No family history on file.  Social History Social History   Tobacco Use  . Smoking status: Current Every Day Smoker    Packs/day: 1.00    Types: Cigarettes  . Smokeless tobacco: Never Used  Substance Use Topics  . Alcohol use: Yes  . Drug use: Not on file    Review of Systems Constitutional: No fever/chills Cardiovascular: Denies chest pain. Respiratory: Denies shortness of breath. Gastrointestinal: No abdominal pain.  No nausea, no vomiting.   Genitourinary: Negative for dysuria.  Negative for vaginal discharge. Musculoskeletal: Negative for back pain. Skin: Negative for rash. Neurological: Negative for headaches, focal weakness or numbness. ___________________________________________   PHYSICAL EXAM:  VITAL SIGNS: ED Triage Vitals [10/13/18 1631]  Enc Vitals Group     BP  129/77     Pulse Rate 66     Resp 16     Temp 98.9 F (37.2 C)     Temp Source Oral     SpO2 98 %     Weight      Height      Head Circumference      Peak Flow      Pain Score 0     Pain Loc      Pain Edu?      Excl. in [place]?    Constitutional: Alert and oriented. Well appearing and in no acute distress. Eyes: Conjunctivae are normal. Neck: No stridor.   Cardiovascular:   Good peripheral circulation. Respiratory: Normal respiratory effort.   Gastrointestinal: Soft and nontender. No distention.  Musculoskeletal: His upper and lower extremities without difficulty and normal gait was noted. Neurologic:  Normal speech and language. No gross focal neurologic deficits are appreciated. No gait instability. Skin:  Skin is warm, dry and intact. No rash noted. Psychiatric: Mood and affect are normal. Speech and behavior are normal.  ____________________________________________   LABS (all labs ordered are listed, but only abnormal results are displayed)  Labs Reviewed - No data to display  PROCEDURES  Procedure(s) performed: None  Procedures  Critical Care performed: No  ____________________________________________   INITIAL IMPRESSION / ASSESSMENT AND PLAN / ED COURSE  As part of my medical decision making, I reviewed the following data within the electronic MEDICAL RECORD NUMBER Notes from prior ED visits and [place] Controlled Substance Database  Patient was seen earlier today by this provider with  concerns about being exposed to an STD.  Initial visit showed trichomonas.  She was later called back and told to return to the ED as her gonorrhea test was positive.  ____________________________________________   FINAL CLINICAL IMPRESSION(S) / ED DIAGNOSES  Final diagnoses:  Gonorrhea in female     ED Discharge Orders    None       Note:  This document was prepared using Dragon voice recognition software and may include unintentional dictation errors.   Johnn Hai, PA-C 10/16/18 1630  Nance Pear, MD 10/23/18 [phone]

## 2018-10-13 NOTE — ED Triage Notes (Signed)
PT seen this am for STD check and was called back for + gonorrhea. PT unable to go to health dept for meds.

## 2018-11-03 ENCOUNTER — Encounter: Payer: Self-pay | Admitting: Emergency Medicine

## 2018-11-03 ENCOUNTER — Other Ambulatory Visit: Payer: Self-pay

## 2018-11-03 ENCOUNTER — Emergency Department
Admission: EM | Admit: 2018-11-03 | Discharge: 2018-11-03 | Disposition: A | Discharge status: Home / Self Care | Payer: Medicaid Other | Attending: Student in an Organized Health Care Education/Training Program | Admitting: Student in an Organized Health Care Education/Training Program

## 2018-11-03 DIAGNOSIS — F17210 Nicotine dependence, cigarettes, uncomplicated: Secondary | ICD-10-CM | POA: Insufficient documentation

## 2018-11-03 DIAGNOSIS — K047 Periapical abscess without sinus: Secondary | ICD-10-CM

## 2018-11-03 DIAGNOSIS — K0889 Other specified disorders of teeth and supporting structures: Secondary | ICD-10-CM | POA: Insufficient documentation

## 2018-11-03 MED ORDER — MAGIC MOUTHWASH W/LIDOCAINE: 5.0000 mL | Freq: Four times a day (QID) | ORAL | 0 refills | Status: DC

## 2018-11-03 MED ORDER — AMOXICILLIN 875 MG PO TABS: 875.0000 mg | ORAL_TABLET | Freq: Two times a day (BID) | ORAL | 0 refills | Status: DC

## 2018-11-03 NOTE — ED Triage Notes (Signed)
Presents with dental pain  States she thinks the pain is coming fro wisdom tooth  Pain is right lower

## 2018-11-03 NOTE — ED Provider Notes (Signed)
[facility] Emergency Department Provider Note  ____________________________________________  Time seen: Approximately 5:14 PM  I have reviewed the triage vital signs and the nursing notes.   HISTORY  Chief Complaint Dental Pain    HPI Jade Allen is a 27 y.o. female patient presents emergency department with right-sided dental pain and swelling.  Patient reports that she believes it is her erosion of her wisdom tooth on the bottom right side.  Patient reports that she does not have a dentist.  Symptoms have been ongoing x3 days.  No medications at home prior to arrival.  Patient denies any fevers or chills, difficulty breathing or swallowing, headache, neck pain, chest pain, shortness of breath.    History reviewed. No pertinent past medical history.  There are no active problems to display for this patient.   History reviewed. No pertinent surgical history.  Prior to Admission medications   Medication Sig Start Date End Date Taking? Authorizing Provider  amoxicillin (AMOXIL) 875 MG tablet Take 1 tablet (875 mg total) by mouth 2 (two) times daily. 11/03/18   Vicky Mccanless, Charline Bills, PA-C  magic mouthwash w/lidocaine SOLN Take 5 mLs by mouth 4 (four) times daily. 11/03/18   Darica Goren, Charline Bills, PA-C  metroNIDAZOLE (FLAGYL) 500 MG tablet Take 1 tablet (500 mg total) by mouth 2 (two) times daily. 10/13/18   Johnn Hai, PA-C    Allergies Patient has no known allergies.  No family history on file.  Social History Social History   Tobacco Use  . Smoking status: Current Every Day Smoker    Packs/day: 1.00    Types: Cigarettes  . Smokeless tobacco: Never Used  Substance Use Topics  . Alcohol use: Yes  . Drug use: Not on file     Review of Systems  Constitutional: No fever/chills Eyes: No visual changes. No discharge ENT: Positive for right lower dental pain Cardiovascular: no chest pain. Respiratory: no cough. No SOB. Gastrointestinal:  No abdominal pain.  No nausea, no vomiting.   Musculoskeletal: Negative for musculoskeletal pain. Skin: Negative for rash, abrasions, lacerations, ecchymosis. Neurological: Negative for headaches, focal weakness or numbness. 10-point ROS otherwise negative.  ____________________________________________   PHYSICAL EXAM:  VITAL SIGNS: ED Triage Vitals  Enc Vitals Group     BP 11/03/18 1647 132/63     Pulse Rate 11/03/18 1647 (!) 55     Resp 11/03/18 1647 16     Temp 11/03/18 1647 98.7 F (37.1 C)     Temp Source 11/03/18 1647 Oral     SpO2 11/03/18 1647 98 %     Weight 11/03/18 1642 227 lb 8.2 oz (103.2 kg)     Height 11/03/18 1642 5\' 3"  (1.6 m)     Head Circumference --      Peak Flow --      Pain Score --      Pain Loc --      Pain Edu? --      Excl. in [place]? --      Constitutional: Alert and oriented. Well appearing and in no acute distress. Eyes: Conjunctivae are normal. PERRL. EOMI. Head: Atraumatic. ENT:      Ears:       Nose: No congestion/rhinnorhea.      Mouth/Throat: Mucous membranes are moist.  Visualization of the oropharynx reveals erosion into the gumline of the second and third molar right lower dentition.  Mild surrounding erythema and edema.  No fluctuance with palpation of tongue depressor.  Uvula is midline. Neck:  No stridor.   Hematological/Lymphatic/Immunilogical: No cervical lymphadenopathy. Cardiovascular: Normal rate, regular rhythm. Normal S1 and S2.  Good peripheral circulation. Respiratory: Normal respiratory effort without tachypnea or retractions. Lungs CTAB. Good air entry to the bases with no decreased or absent breath sounds. Musculoskeletal: Full range of motion to all extremities. No gross deformities appreciated. Neurologic:  Normal speech and language. No gross focal neurologic deficits are appreciated.  Skin:  Skin is warm, dry and intact. No rash noted. Psychiatric: Mood and affect are normal. Speech and behavior are normal. Patient  exhibits appropriate insight and judgement.   ____________________________________________   LABS (all labs ordered are listed, but only abnormal results are displayed)  Labs Reviewed - No data to display ____________________________________________  EKG   ____________________________________________  RADIOLOGY   No results found.  ____________________________________________    PROCEDURES  Procedure(s) performed:    Procedures    Medications - No data to display   ____________________________________________   INITIAL IMPRESSION / ASSESSMENT AND PLAN / ED COURSE  Pertinent labs & imaging results that were available during my care of the patient were reviewed by me and considered in my medical decision making (see chart for details).  Review of the [place] CSRS was performed in accordance of the [place] prior to dispensing any controlled drugs.      Patient's diagnosis is consistent with dental infection.  Visualization of the dentition reveals mild erythema and edema.  No indication of deep space infection.  Patient was placed on amoxicillin, Magic mouthwash for symptom control.  Follow-up with dentist. Patient is given ED precautions to return to the ED for any worsening or new symptoms.     ____________________________________________  FINAL CLINICAL IMPRESSION(S) / ED DIAGNOSES  Final diagnoses:  Dental infection      NEW MEDICATIONS STARTED DURING THIS VISIT:  ED Discharge Orders         Ordered    amoxicillin (AMOXIL) 875 MG tablet  2 times daily     11/03/18 1721    magic mouthwash w/lidocaine SOLN  4 times daily    Note to Pharmacy:  Dispense in a 1/1/1 ratio. Use lidocaine, diphenhydramine, prednisolone   11/03/18 1721              This chart was dictated using voice recognition software/Dragon. Despite best efforts to proofread, errors can occur which can change the meaning. Any change was purely unintentional.    Darletta Moll, PA-C 11/03/18 1724    Merlyn Lot, MD 11/03/18 1901

## 2018-11-03 NOTE — Discharge Instructions (Signed)
OPTIONS FOR DENTAL FOLLOW UP CARE ° °[place] Department of Health and Human Services - Local Safety Net Dental Clinics °http://www.ncdhhs.gov/dph/oralhealth/services/safetynetclinics.htm °  °[facility] (336-562-3123) ° °[facility] ([phone]) ° °[facility] ([phone] ext [phone]) ° °[facility] ([phone]) ° °[facility] ([phone]) °This clinic caters to the indigent population and is on a lottery system. °Location: °[facility], [address], [place] °Clinic Hours: °Wednesdays from 6pm - 9pm, patients seen by a lottery system. °For dates, call or go to [email or web address]/[facility]ac/patients/[email or web address] °Services: °Cleanings, fillings and simple extractions. °Payment Options: °DENTAL WORK IS FREE OF CHARGE. Bring proof of income or support. °Best way to get seen: °Arrive at 5:15 pm - this is a lottery, NOT first come/first serve, so arriving earlier will not increase your chances of being seen. °  °  °[facility] °[phone] °Select option 1 for emergencies °  °Location: °[facility], [address], [place] °Clinic Hours: °No walk-ins accepted - call the day before to schedule an appointment. °Check in times are 9:30 am and 1:30 pm. °Services: °Simple extractions, temporary fillings, pulpectomy/pulp debridement, uncomplicated abscess drainage. °Payment Options: °PAYMENT IS DUE AT THE TIME OF SERVICE.  Fee is usually $100-200, additional surgical procedures (e.g. abscess drainage) may be extra. °Cash, checks, Visa/MasterCard accepted.  Can file Medicaid if patient is covered for dental - patient should call case worker to check. °No discount for [facility] patients. °Best way to get seen: °MUST call the day before and get onto the schedule. Can usually be seen the next 1-2 days. No walk-ins accepted. °  °  °[facility] °[phone] °   °Location: °[facility], [address], [place] °Clinic Hours: °M, W, Th, F 8am or 1:30pm, Tues 9a or 1:30 - first come/first served. °Services: °Simple extractions, temporary fillings, uncomplicated abscess drainage.  You do not need to be an [place] resident. °Payment Options: °PAYMENT IS DUE AT THE TIME OF SERVICE. °Dental insurance, otherwise sliding scale - bring proof of income or support. °Depending on income and treatment needed, cost is usually $50-200. °Best way to get seen: °Arrive early as it is first come/first served. °  °  °[facility] °[phone] °  °Location: °[address] °Clinic Hours: °Mon-Thu 8a-5p °Services: °Most basic dental services including extractions and fillings. °Payment Options: °PAYMENT IS DUE AT THE TIME OF SERVICE. °Sliding scale, up to 50% off - bring proof if income or support. °Medicaid with dental option accepted. °Best way to get seen: °Call to schedule an appointment, can usually be seen within 2 weeks OR they will try to see walk-ins - show up at 8a or 2p (you may have to wait). °  °  °[facility] °[phone] °[place] RESIDENTS ONLY °  °Location: °[facility], [address], [place], [place] [address] °Clinic Hours: By appointment only. °Monday - Thursday 8am-5pm, Friday 8am-12pm °Services: Cleanings, fillings, extractions. °Payment Options: °PAYMENT IS DUE AT THE TIME OF SERVICE. °Cash, Visa or MasterCard. Sliding scale - $30 minimum per service. °Best way to get seen: °Come in to office, complete packet and make an appointment - need proof of income °or support monies for each household member and proof of [place] residence. °Usually takes about a month to get in. °  °  °[facility] °[phone] °  °Location: °[address]   [place] °Clinic Hours: Walk-in Urgent Care Dental Services are offered Monday-Friday  mornings only. °The numbers of emergencies accepted daily is limited to the number of °providers available. °Maximum 15 - Mondays, Wednesdays & Thursdays °Maximum 10 - Tuesdays & Fridays °Services: °You do not need to be a [place] resident to be seen for a dental emergency. °Emergencies are defined as pain, swelling, abnormal bleeding, or dental trauma. Walkins will receive x-rays if needed. °NOTE: Dental cleaning is not an emergency. °Payment Options: °PAYMENT IS DUE AT THE TIME OF SERVICE. °Minimum co-pay is $40.00 for uninsured patients. °Minimum co-pay is $3.00 for Medicaid with dental coverage. °Dental Insurance is accepted and must be presented at time of visit. °Medicare does not cover dental. °Forms of payment: Cash, credit card, checks. °Best way to get seen: °If not previously registered with the clinic, walk-in dental registration begins at 7:15 am and is on a first come/first serve basis. °If previously registered with the clinic, call to make an appointment. °  °  °[facility] °[phone] °[place] RESIDENTS ONLY °  °Location: °[address], [place], [place] °Clinic Hours: °Mon-Thu 10a-2p °Services: Extractions only! °Payment Options: °FREE (donations accepted) - bring proof of income or support °Best way to get seen: °Call and schedule an appointment OR come at 8am on the 1st Monday of every month (except for holidays) when it is first come/first served. °  °  °[facility] °[phone] °  °Location: °[address], [place] °Clinic Hours: °Friday mornings °Services, Payment Options, Best way to get seen: °Call for info °

## 2019-07-12 ENCOUNTER — Other Ambulatory Visit: Payer: Self-pay

## 2019-07-12 ENCOUNTER — Emergency Department: Payer: Medicaid Other

## 2019-07-12 ENCOUNTER — Encounter: Payer: Self-pay | Admitting: Emergency Medicine

## 2019-07-12 ENCOUNTER — Emergency Department
Admission: EM | Admit: 2019-07-12 | Discharge: 2019-07-12 | Disposition: A | Discharge status: Home / Self Care | Payer: Medicaid Other | Attending: Emergency Medicine | Admitting: Emergency Medicine

## 2019-07-12 DIAGNOSIS — F17210 Nicotine dependence, cigarettes, uncomplicated: Secondary | ICD-10-CM | POA: Insufficient documentation

## 2019-07-12 DIAGNOSIS — N83209 Unspecified ovarian cyst, unspecified side: Secondary | ICD-10-CM

## 2019-07-12 DIAGNOSIS — R102 Pelvic and perineal pain: Secondary | ICD-10-CM | POA: Insufficient documentation

## 2019-07-12 LAB — CBC
HCT: 38.4 % (ref 36.0–46.0)
Hemoglobin: 12.5 g/dL (ref 12.0–15.0)
MCH: 25.5 pg — ABNORMAL LOW (ref 26.0–34.0)
MCHC: 32.6 g/dL (ref 30.0–36.0)
MCV: 78.4 fL — ABNORMAL LOW (ref 80.0–100.0)
Platelets: 346 10*3/uL (ref 150–400)
RBC: 4.9 MIL/uL (ref 3.87–5.11)
RDW: 14.3 % (ref 11.5–15.5)
WBC: 14.1 10*3/uL — ABNORMAL HIGH (ref 4.0–10.5)
nRBC: 0 % (ref 0.0–0.2)

## 2019-07-12 LAB — COMPREHENSIVE METABOLIC PANEL
ALT: 18 U/L (ref 0–44)
AST: 15 U/L (ref 15–41)
Albumin: 4 g/dL (ref 3.5–5.0)
Alkaline Phosphatase: 50 U/L (ref 38–126)
Anion gap: 9 (ref 5–15)
BUN: 6 mg/dL (ref 6–20)
CO2: 24 mmol/L (ref 22–32)
Calcium: 8.8 mg/dL — ABNORMAL LOW (ref 8.9–10.3)
Chloride: 109 mmol/L (ref 98–111)
Creatinine, Ser: 0.6 mg/dL (ref 0.44–1.00)
GFR calc Af Amer: >60 mL/min (ref >60)
GFR calc non Af Amer: >60 mL/min (ref >60)
Glucose, Bld: 109 mg/dL — ABNORMAL HIGH (ref 70–99)
Potassium: 3.3 mmol/L — ABNORMAL LOW (ref 3.5–5.1)
Sodium: 142 mmol/L (ref 135–145)
Total Bilirubin: 1 mg/dL (ref 0.3–1.2)
Total Protein: 7 g/dL (ref 6.5–8.1)

## 2019-07-12 LAB — URINALYSIS, COMPLETE (UACMP) WITH MICROSCOPIC
Bilirubin Urine: NEGATIVE
Glucose, UA: NEGATIVE mg/dL
Hgb urine dipstick: NEGATIVE
Ketones, ur: NEGATIVE mg/dL
Leukocytes,Ua: NEGATIVE
Nitrite: NEGATIVE
Protein, ur: NEGATIVE mg/dL
Specific Gravity, Urine: 1.014 (ref 1.005–1.030)
pH: 8 (ref 5.0–8.0)

## 2019-07-12 LAB — WET PREP, GENITAL
Sperm: NONE SEEN
Trich, Wet Prep: NONE SEEN
Yeast Wet Prep HPF POC: NONE SEEN

## 2019-07-12 LAB — LIPASE, BLOOD: Lipase: 19 U/L (ref 11–51)

## 2019-07-12 LAB — POCT PREGNANCY, URINE: Preg Test, Ur: NEGATIVE

## 2019-07-12 MED ORDER — LIDOCAINE HCL (PF) 1 % IJ SOLN
5.0000 mL | Freq: Once | INTRAMUSCULAR | Status: AC
  Administered 2019-07-12: 15:00:00 5 mL via INTRADERMAL

## 2019-07-12 MED ORDER — LIDOCAINE HCL (PF) 1 % IJ SOLN
INTRAMUSCULAR | Status: AC
  Administered 2019-07-12: 15:00:00 5 mL via INTRADERMAL
  Filled 2019-07-12: qty 5

## 2019-07-12 MED ORDER — METRONIDAZOLE 500 MG PO TABS: 500.0000 mg | ORAL_TABLET | Freq: Two times a day (BID) | ORAL | 0 refills | Status: DC

## 2019-07-12 MED ORDER — OXYCODONE-ACETAMINOPHEN 5-325 MG PO TABS: 1.0000 | ORAL_TABLET | Freq: Three times a day (TID) | ORAL | 0 refills | Status: DC | PRN

## 2019-07-12 MED ORDER — AZITHROMYCIN 500 MG PO TABS
1000.0000 mg | ORAL_TABLET | Freq: Once | ORAL | Status: AC
  Administered 2019-07-12: 15:00:00 1000 mg via ORAL
  Filled 2019-07-12: qty 2

## 2019-07-12 MED ORDER — CEFTRIAXONE SODIUM 250 MG IJ SOLR
250.0000 mg | INTRAMUSCULAR | Status: DC
  Administered 2019-07-12: 15:00:00 250 mg via INTRAMUSCULAR
  Filled 2019-07-12: qty 250

## 2019-07-12 MED ORDER — SODIUM CHLORIDE 0.9% FLUSH: 3.0000 mL | Freq: Once | INTRAVENOUS | Status: DC

## 2019-07-12 MED ORDER — OXYCODONE-ACETAMINOPHEN 5-325 MG PO TABS
2.0000 | ORAL_TABLET | Freq: Once | ORAL | Status: AC
  Administered 2019-07-12: 15:00:00 2 via ORAL
  Filled 2019-07-12: qty 2

## 2019-07-12 NOTE — ED Provider Notes (Addendum)
[facility] Emergency Department Provider Note       Time seen: ----------------------------------------- 12:15 PM on 07/12/2019 -----------------------------------------   I have reviewed the triage vital signs and the nursing notes.  HISTORY   Chief Complaint Abdominal Pain    HPI Jade Allen is a 28 y.o. female with no known past medical history who presents to the ED for lower abdominal pain that is worse on the right side.  Pain seemed to start during and worsened after intercourse.  Symptoms have been going on for 2 to 3 days.  She denies fevers, chills or other complaints.  History reviewed. No pertinent past medical history.  There are no active problems to display for this patient.   History reviewed. No pertinent surgical history.  Allergies Patient has no known allergies.  Social History Social History   Tobacco Use  . Smoking status: Current Every Day Smoker    Packs/day: 1.00    Types: Cigarettes  . Smokeless tobacco: Never Used  Substance Use Topics  . Alcohol use: Yes  . Drug use: Not on file   Review of Systems Constitutional: Negative for fever. Cardiovascular: Negative for chest pain. Respiratory: Negative for shortness of breath. Gastrointestinal: Positive for abdominal pain Musculoskeletal: Negative for back pain. Skin: Negative for rash. Neurological: Negative for headaches, focal weakness or numbness.  All systems negative/normal/unremarkable except as stated in the HPI  ____________________________________________   PHYSICAL EXAM:  VITAL SIGNS: ED Triage Vitals  Enc Vitals Group     BP 07/12/19 1048 132/77     Pulse Rate 07/12/19 1048 66     Resp 07/12/19 1048 20     Temp 07/12/19 1048 98.6 F (37 C)     Temp Source 07/12/19 1048 Oral     SpO2 07/12/19 1048 98 %     Weight 07/12/19 1048 220 lb (99.8 kg)     Height 07/12/19 1048 5\' 3"  (1.6 m)     Head Circumference --      Peak Flow --      Pain  Score 07/12/19 1051 10     Pain Loc --      Pain Edu? --      Excl. in [place]? --    Constitutional: Alert and oriented. Well appearing and in no distress. Eyes: Conjunctivae are normal. Normal extraocular movements. ENT      Head: Normocephalic and atraumatic.      Nose: No congestion/rhinnorhea.      Mouth/Throat: Mucous membranes are moist.      Neck: No stridor. Cardiovascular: Normal rate, regular rhythm. No murmurs, rubs, or gallops. Respiratory: Normal respiratory effort without tachypnea nor retractions. Breath sounds are clear and equal bilaterally. No wheezes/rales/rhonchi. Gastrointestinal: Soft and nontender. Normal bowel sounds Musculoskeletal: Nontender with normal range of motion in extremities. No lower extremity tenderness nor edema. Neurologic:  Normal speech and language. No gross focal neurologic deficits are appreciated.  Skin:  Skin is warm, dry and intact. No rash noted. Psychiatric: Mood and affect are normal. Speech and behavior are normal.  ____________________________________________  ED COURSE:  As part of my medical decision making, I reviewed the following data within the [facility] History obtained from family if available, nursing notes, old chart and ekg, as well as notes from prior ED visits. Patient presented for lower abdominal pain, we will assess with labs and imaging as indicated at this time.   Procedures  Jade Allen was evaluated in Emergency Department on 07/12/2019 for the  symptoms described in the history of present illness. She was evaluated in the context of the global COVID-19 pandemic, which necessitated consideration that the patient might be at risk for infection with the SARS-CoV-2 virus that causes COVID-19. Institutional protocols and algorithms that pertain to the evaluation of patients at risk for COVID-19 are in a state of rapid change based on information released by regulatory bodies including the CDC and federal and  state organizations. These policies and algorithms were followed during the patient's care in the ED.  ____________________________________________   LABS (pertinent positives/negatives)  Labs Reviewed  WET PREP, GENITAL - Abnormal; Notable for the following components:      Result Value   Clue Cells Wet Prep HPF POC PRESENT (*)    WBC, Wet Prep HPF POC MODERATE (*)    All other components within normal limits  COMPREHENSIVE METABOLIC PANEL - Abnormal; Notable for the following components:   Potassium 3.3 (*)    Glucose, Bld 109 (*)    Calcium 8.8 (*)    All other components within normal limits  CBC - Abnormal; Notable for the following components:   WBC 14.1 (*)    MCV 78.4 (*)    MCH 25.5 (*)    All other components within normal limits  URINALYSIS, COMPLETE (UACMP) WITH MICROSCOPIC - Abnormal; Notable for the following components:   Color, Urine YELLOW (*)    APPearance CLEAR (*)    Bacteria, UA RARE (*)    All other components within normal limits  GC/CHLAMYDIA PROBE AMP  LIPASE, BLOOD  POC URINE PREG, ED  POCT PREGNANCY, URINE    RADIOLOGY Images were viewed by me  Pelvic ultrasound   IMPRESSION:  1. 3.2 cm uterine fibroid.   2. Small area of decreased echogenicity noted adjacent to the right  ovary. This could represent edema next to the right ovary. Pregnancy  test suggested to exclude ectopic pregnancy. Exam otherwise  unremarkable.  ____________________________________________   DIFFERENTIAL DIAGNOSIS   PID, ovarian cyst, torsion, appendicitis, renal colic, UTI  FINAL ASSESSMENT AND PLAN  Pelvic pain, likely ruptured ovarian cyst, fibroid   Plan: The patient had presented for pelvic pain. Patient's labs did reveal leukocytosis, and wet prep had moderate white blood cells with clue cells. Patient's imaging revealed a small fibroid as well as decreased echogenicity adjacent to the right ovary.  Likely ruptured ovarian cyst.  I will offer antibiotics to  cover for STI as her GC and Chlamydia testing is still pending.  She did request treatment for this until the test results return.  She was given Rocephin and Zithromax.  Otherwise she be discharged with pain medicine and outpatient follow-up.   Laurence Aly, MD    Note: This note was generated in part or whole with voice recognition software. Voice recognition is usually quite accurate but there are transcription errors that can and very often do occur. I apologize for any typographical errors that were not detected and corrected.     Earleen Newport, MD 07/12/19 1449    Earleen Newport, MD 07/12/19 [phone]

## 2019-07-12 NOTE — ED Triage Notes (Addendum)
C/O lower abdominal pain, worse to right side, x 2-3 days.  Denies dysuria, N/V.  Patient is AAOx3.  Skin warm and dry. NAD  States pain returned during inter coarse.

## 2019-07-12 NOTE — ED Notes (Signed)
Pt reporting significant increase in pain after [place]. Pain medication given and pt resting in bed at this time with lights dimmed awaiting results and update.

## 2019-07-12 NOTE — ED Notes (Signed)
Patient transported to Ultrasound 

## 2019-07-12 NOTE — ED Notes (Signed)
Delay in documentation. Pt left ED at 1540 in NAD with discharge papers in hand.

## 2019-07-17 LAB — GC/CHLAMYDIA PROBE AMP
Chlamydia trachomatis, NAA: NEGATIVE
Neisseria Gonorrhoeae by PCR: POSITIVE — AB

## 2019-07-18 ENCOUNTER — Telehealth: Payer: Self-pay | Admitting: Emergency Medicine

## 2019-07-18 NOTE — Telephone Encounter (Signed)
Called to inform patient of positive gonorhea test.  She was treated in the ED.  No answer.  Left message askingher to call me.

## 2019-07-19 NOTE — Telephone Encounter (Signed)
I was going to send letter, but patient just called me and I gave her results and asked her to inform partner and that free treatment is available at [facility] if he has not been treated yet.

## 2019-07-30 ENCOUNTER — Encounter: Payer: Self-pay | Admitting: Obstetrics & Gynecology

## 2019-07-31 ENCOUNTER — Other Ambulatory Visit: Payer: Self-pay | Admitting: Obstetrics & Gynecology

## 2019-07-31 ENCOUNTER — Ambulatory Visit (INDEPENDENT_AMBULATORY_CARE_PROVIDER_SITE_OTHER): Payer: Self-pay | Admitting: Obstetrics & Gynecology

## 2019-07-31 ENCOUNTER — Other Ambulatory Visit: Payer: Self-pay

## 2019-07-31 ENCOUNTER — Encounter: Payer: Self-pay | Admitting: Obstetrics & Gynecology

## 2019-07-31 ENCOUNTER — Other Ambulatory Visit (HOSPITAL_COMMUNITY)
Admission: RE | Admit: 2019-07-31 | Discharge: 2019-07-31 | Disposition: A | Discharge status: Home / Self Care | Payer: Medicaid Other | Source: Ambulatory Visit | Attending: Obstetrics & Gynecology | Admitting: Obstetrics & Gynecology

## 2019-07-31 VITALS — BP 108/62 | HR 87 | Ht 63.0 in | Wt 237.0 lb

## 2019-07-31 DIAGNOSIS — R102 Pelvic and perineal pain: Secondary | ICD-10-CM

## 2019-07-31 DIAGNOSIS — A549 Gonococcal infection, unspecified: Secondary | ICD-10-CM

## 2019-07-31 DIAGNOSIS — D219 Benign neoplasm of connective and other soft tissue, unspecified: Secondary | ICD-10-CM | POA: Insufficient documentation

## 2019-07-31 NOTE — Progress Notes (Signed)
Gynecology Pelvic Pain Evaluation   Chief Complaint:  Chief Complaint  Patient presents with  . Follow-up    ER f/u Ruptured ovarian cyst- concerned about fertility    History of Present Illness:   Patient is a 28 y.o. G0P0000 who LMP was Patient's last menstrual period was 07/10/2019., presents today for a problem visit.  She complains of pain.   Her pain is localized to the deep pelvis area, described as intermittent, began several weeks ago and its severity is described as moderate. The pain radiates to the  Non-radiating. She has these associated symptoms which include none. Patient has these modifiers which include she was seen in ER and treated for gonorrhea infection and has since improved; also noted to have fibroid and evidence for small ovarian cysts at that time.  No prior pregnancies, desires in the near future. No prior h/o fibroids, cysts. No recent use of contraceptives.  PMHx: She  has no past medical history on file. Also,  has a past surgical history that includes No past surgeries., family history includes Cancer (age of onset: 37) in her maternal grandfather; Diabetes in her mother; Lung cancer (age of onset: 39) in her father.,  reports that she has been smoking cigarettes. She has been smoking about 1.00 pack per day. She has never used smokeless tobacco. She reports current alcohol use. She reports that she does not use drugs.  She currently has no medications in their medication list. Also, has No Known Allergies.  Review of Systems  Constitutional: Negative for chills, fever and malaise/fatigue.  HENT: Negative for congestion, sinus pain and sore throat.   Eyes: Negative for blurred vision and pain.  Respiratory: Negative for cough and wheezing.   Cardiovascular: Negative for chest pain and leg swelling.  Gastrointestinal: Negative for abdominal pain, constipation, diarrhea, heartburn, nausea and vomiting.  Genitourinary: Negative for dysuria, frequency,  hematuria and urgency.  Musculoskeletal: Negative for back pain, joint pain, myalgias and neck pain.  Skin: Negative for itching and rash.  Neurological: Negative for dizziness, tremors and weakness.  Endo/Heme/Allergies: Does not bruise/bleed easily.  Psychiatric/Behavioral: Negative for depression. The patient is not nervous/anxious and does not have insomnia.     Objective: BP 108/62 (BP Location: Right Arm, Patient Position: Sitting, Cuff Size: Large)   Pulse 87   Ht 5\' 3"  (1.6 m)   Wt 237 lb (107.5 kg)   LMP 07/10/2019   BMI 41.98 kg/m  Physical Exam Constitutional:      General: She is not in acute distress.    Appearance: She is well-developed.  Genitourinary:     Pelvic exam was performed with patient supine.     Vagina and uterus normal.     No vaginal erythema or bleeding.     No cervical motion tenderness, discharge, polyp or nabothian cyst.     Uterus is mobile.     Uterus is not enlarged.     No uterine mass detected.    Uterus is midaxial.     No right or left adnexal mass present.     Right adnexa not tender.     Left adnexa not tender.  HENT:     Head: Normocephalic and atraumatic.     Nose: Nose normal.  Abdominal:     General: There is no distension.     Palpations: Abdomen is soft.     Tenderness: There is no abdominal tenderness.  Musculoskeletal: Normal range of motion.  Neurological:     Mental  Status: She is alert and oriented to person, place, and time.     Cranial Nerves: No cranial nerve deficit.  Skin:    General: Skin is warm and dry.  Psychiatric:        Attention and Perception: Attention normal.        Mood and Affect: Mood and affect normal.        Speech: Speech normal.        Behavior: Behavior normal.        Thought Content: Thought content normal.        Judgment: Judgment normal.     Female chaperone present for pelvic portion of the physical exam  Assessment: 28 y.o. G0P0000 with pain and recent infection.  1. Pelvic  pain Resolved. May be related to gonorrhea infection; risk for future PID discussed.  Also could be fibroid or cyst related pain  2. Gonorrhea in female - Re test for cure today - GC/Chlamydia probe amp ([place])not at [facility]  3. Fibroid Monitor long term  Fibroid treatment such as [place], Lupron, Myomectomy, and Hysterectomy discussed in detail, with the pros and cons of each choice counseled.  No treatment as an option also discussed, as well as control of symptoms alone with hormone therapy. Information provided to the patient.  Pt to let us know any difficulties w pregnancy attempts.  May benefit from follow up US, even HSG.   Barnett Applebaum, MD, [facility], [facility] 07/31/2019  4:42 PM

## 2019-08-01 ENCOUNTER — Encounter: Payer: Self-pay | Admitting: Obstetrics & Gynecology

## 2019-08-02 LAB — CERVICOVAGINAL ANCILLARY ONLY
Chlamydia: NEGATIVE
Neisseria Gonorrhea: NEGATIVE

## 2019-08-04 ENCOUNTER — Encounter: Payer: Self-pay | Admitting: Obstetrics & Gynecology

## 2019-08-21 ENCOUNTER — Emergency Department
Admission: EM | Admit: 2019-08-21 | Discharge: 2019-08-21 | Disposition: A | Discharge status: Home / Self Care | Payer: Medicaid Other | Attending: Emergency Medicine | Admitting: Emergency Medicine

## 2019-08-21 ENCOUNTER — Ambulatory Visit: Payer: Self-pay | Admitting: Advanced Practice Midwife

## 2019-08-21 ENCOUNTER — Encounter: Payer: Self-pay | Admitting: Intensive Care

## 2019-08-21 ENCOUNTER — Other Ambulatory Visit: Payer: Self-pay

## 2019-08-21 DIAGNOSIS — F17210 Nicotine dependence, cigarettes, uncomplicated: Secondary | ICD-10-CM | POA: Insufficient documentation

## 2019-08-21 DIAGNOSIS — Z5321 Procedure and treatment not carried out due to patient leaving prior to being seen by health care provider: Secondary | ICD-10-CM

## 2019-08-21 DIAGNOSIS — F909 Attention-deficit hyperactivity disorder, unspecified type: Secondary | ICD-10-CM | POA: Insufficient documentation

## 2019-08-21 DIAGNOSIS — Z202 Contact with and (suspected) exposure to infections with a predominantly sexual mode of transmission: Secondary | ICD-10-CM

## 2019-08-21 HISTORY — DX: Attention-deficit hyperactivity disorder, unspecified type: F90.9

## 2019-08-21 HISTORY — DX: Gonococcal infection, unspecified: A54.9

## 2019-08-21 LAB — POCT PREGNANCY, URINE: Preg Test, Ur: NEGATIVE

## 2019-08-21 NOTE — ED Provider Notes (Signed)
[facility] Emergency Department Provider Note   ____________________________________________   First MD Initiated Contact with Patient 08/21/19 0802     (approximate)  I have reviewed the triage vital signs and the nursing notes.   HISTORY  Chief Complaint Exposure to STD    HPI Jade Allen is a 28 y.o. female patient states she had intercourse for sexual partner 2 days ago.  Patient yesterday her partner told her he was positive for STD.  Patient was not told what type of STD tested positive.  Patient states having no symptoms.  Patient has a past history of gonorrhea.    Past Medical History:  Diagnosis Date  . ADHD   . Gonorrhea     Patient Active Problem List   Diagnosis Date Noted  . Fibroid 07/31/2019    Past Surgical History:  Procedure Laterality Date  . NO PAST SURGERIES      Prior to Admission medications   Not on File    Allergies Patient has no known allergies.  Family History  Problem Relation Age of Onset  . Diabetes Mother   . Lung cancer Father 75  . Cancer Maternal Grandfather 88       Type unknown    Social History Social History   Tobacco Use  . Smoking status: Current Every Day Smoker    Packs/day: 1.00    Types: Cigarettes  . Smokeless tobacco: Never Used  Substance Use Topics  . Alcohol use: Yes    Comment: occasional  . Drug use: Yes    Types: Marijuana    Review of Systems Constitutional: No fever/chills Eyes: No visual changes. ENT: No sore throat. Cardiovascular: Denies chest pain. Respiratory: Denies shortness of breath. Gastrointestinal: No abdominal pain.  No nausea, no vomiting.  No diarrhea.  No constipation. Genitourinary: Negative for dysuria. Musculoskeletal: Negative for back pain. Skin: Negative for rash. Neurological: Negative for headaches, focal weakness or numbness. Psychiatric: ADD   ____________________________________________   PHYSICAL EXAM:  VITAL SIGNS: ED  Triage Vitals  Enc Vitals Group     BP 08/21/19 0759 113/71     Pulse Rate 08/21/19 0757 63     Resp 08/21/19 0757 14     Temp 08/21/19 0757 98.8 F (37.1 C)     Temp Source 08/21/19 0757 Oral     SpO2 08/21/19 0757 98 %     Weight 08/21/19 0758 236 lb (107 kg)     Height 08/21/19 0758 5\' 3"  (1.6 m)     Head Circumference --      Peak Flow --      Pain Score 08/21/19 0758 0     Pain Loc --      Pain Edu? --      Excl. in [place]? --    Constitutional: Alert and oriented. Well appearing and in no acute distress. Cardiovascular: Normal rate, regular rhythm. Grossly normal heart sounds.  Good peripheral circulation. Respiratory: Normal respiratory effort.  No retractions. Lungs CTAB. Genitourinary: Deferred ____________________________________________   LABS (all labs ordered are listed, but only abnormal results are displayed)  Labs Reviewed  GC/CHLAMYDIA PROBE AMP   ____________________________________________  EKG   ____________________________________________  RADIOLOGY  ED MD interpretation:    Official radiology report(s): No results found.  ____________________________________________   PROCEDURES  Procedure(s) performed (including Critical Care):  Procedures   ____________________________________________   INITIAL IMPRESSION / ASSESSMENT AND PLAN / ED COURSE  As part of my medical decision making, I reviewed the following  data within the electronic MEDICAL RECORD NUMBER         Jade Allen was evaluated in Emergency Department on 08/21/2019 for the symptoms described in the history of present illness. She was evaluated in the context of the global COVID-19 pandemic, which necessitated consideration that the patient might be at risk for infection with the SARS-CoV-2 virus that causes COVID-19. Institutional protocols and algorithms that pertain to the evaluation of patients at risk for COVID-19 are in a state of rapid change based on information released by  regulatory bodies including the CDC and federal and state organizations. These policies and algorithms were followed during the patient's care in the ED.  Patient presents with positive STD exposure.  Patient asymptomatic.  Urine sample was obtained.  Patient will be notified if positive results.  Patient also may follow-up with [place].      ____________________________________________   FINAL CLINICAL IMPRESSION(S) / ED DIAGNOSES  Final diagnoses:  Possible exposure to STD     ED Discharge Orders    None       Note:  This document was prepared using Dragon voice recognition software and may include unintentional dictation errors.    Sable Feil, PA-C 08/21/19 [id number]    Arta Silence, MD 08/21/19 1315

## 2019-08-21 NOTE — ED Notes (Signed)
See triage note  Presents request STD check  States she was told that her partner was positive for STD but she is not having any sx's

## 2019-08-21 NOTE — Progress Notes (Signed)
Patient left without being seen by provider.Jenetta Downer, RN

## 2019-08-21 NOTE — ED Triage Notes (Signed)
Patient reports having intercourse 08/19/19 and her partner told her yesterday they were positive for STDs but wouldn't tell her which one. HX gonorrhea

## 2019-08-21 NOTE — Discharge Instructions (Addendum)
Advised to follow-up with [facility] for definitive evaluation and treatment.

## 2019-08-23 ENCOUNTER — Telehealth: Payer: Self-pay | Admitting: Emergency Medicine

## 2019-08-23 LAB — GC/CHLAMYDIA PROBE AMP
Chlamydia trachomatis, NAA: NEGATIVE
Neisseria Gonorrhoeae by PCR: POSITIVE — AB

## 2019-08-23 NOTE — Telephone Encounter (Signed)
Called patient to inform of std test results positive for gonorrhea and she needs treatment.  I would like her to go to [facility] for treatment, but she may return here if she wishes.  I left her a message asking her to call me today.

## 2019-08-24 ENCOUNTER — Encounter: Payer: Self-pay | Admitting: Emergency Medicine

## 2019-08-24 ENCOUNTER — Emergency Department
Admission: EM | Admit: 2019-08-24 | Discharge: 2019-08-24 | Disposition: A | Discharge status: Home / Self Care | Payer: Medicaid Other | Attending: Emergency Medicine | Admitting: Emergency Medicine

## 2019-08-24 ENCOUNTER — Other Ambulatory Visit: Payer: Self-pay

## 2019-08-24 DIAGNOSIS — F17210 Nicotine dependence, cigarettes, uncomplicated: Secondary | ICD-10-CM | POA: Insufficient documentation

## 2019-08-24 DIAGNOSIS — A549 Gonococcal infection, unspecified: Secondary | ICD-10-CM | POA: Insufficient documentation

## 2019-08-24 DIAGNOSIS — F909 Attention-deficit hyperactivity disorder, unspecified type: Secondary | ICD-10-CM | POA: Insufficient documentation

## 2019-08-24 MED ORDER — AZITHROMYCIN 500 MG PO TABS
1000.0000 mg | ORAL_TABLET | Freq: Once | ORAL | Status: AC
  Administered 2019-08-24: 11:00:00 1000 mg via ORAL
  Filled 2019-08-24: qty 2

## 2019-08-24 MED ORDER — CEFTRIAXONE SODIUM 250 MG IJ SOLR
250.0000 mg | Freq: Once | INTRAMUSCULAR | Status: AC
  Administered 2019-08-24: 250 mg via INTRAMUSCULAR
  Filled 2019-08-24: qty 250

## 2019-08-24 NOTE — ED Triage Notes (Signed)
Was called yesterday about positive STD result. Here for abx.

## 2019-08-24 NOTE — ED Provider Notes (Signed)
[facility] Emergency Department Provider Note   ____________________________________________   First MD Initiated Contact with Patient 08/24/19 1040     (approximate)  I have reviewed the triage vital signs and the nursing notes.   HISTORY  Chief Complaint Other (needs abx injection)    HPI Jade Allen is a 28 y.o. female patient returns today for follow-up of lab results showing that she was positive for gonorrhea.  Patient recently received antibiotic therapy.         Past Medical History:  Diagnosis Date  . ADHD   . Gonorrhea     Patient Active Problem List   Diagnosis Date Noted  . Fibroid 07/31/2019    Past Surgical History:  Procedure Laterality Date  . NO PAST SURGERIES      Prior to Admission medications   Not on File    Allergies Patient has no known allergies.  Family History  Problem Relation Age of Onset  . Diabetes Mother   . Lung cancer Father 2  . Cancer Maternal Grandfather 20       Type unknown    Social History Social History   Tobacco Use  . Smoking status: Current Every Day Smoker    Packs/day: 1.00    Types: Cigarettes  . Smokeless tobacco: Never Used  Substance Use Topics  . Alcohol use: Yes    Comment: occasional  . Drug use: Yes    Types: Marijuana    Review of Systems Constitutional: No fever/chills Eyes: No visual changes. ENT: No sore throat. Cardiovascular: Denies chest pain. Respiratory: Denies shortness of breath. Gastrointestinal: No abdominal pain.  No nausea, no vomiting.  No diarrhea.  No constipation. Genitourinary: Negative for dysuria. Musculoskeletal: Negative for back pain. Skin: Negative for rash. Neurological: Negative for headaches, focal weakness or numbness.   ____________________________________________   PHYSICAL EXAM:  VITAL SIGNS: ED Triage Vitals  Enc Vitals Group     BP 08/24/19 1040 110/75     Pulse Rate 08/24/19 1040 68     Resp 08/24/19 1040 18      Temp 08/24/19 1040 99.3 F (37.4 C)     Temp Source 08/24/19 1040 Oral     SpO2 08/24/19 1040 100 %     Weight 08/24/19 1037 236 lb (107 kg)     Height --      Head Circumference --      Peak Flow --      Pain Score 08/24/19 1037 0     Pain Loc --      Pain Edu? --      Excl. in [place]? --     Constitutional: Alert and oriented. Well appearing and in no acute distress. Cardiovascular: Normal rate, regular rhythm. Grossly normal heart sounds.  Good peripheral circulation. Respiratory: Normal respiratory effort.  No retractions. Lungs CTAB. Genitourinary: Deferred Musculoskeletal: No lower extremity tenderness nor edema.  No joint effusions. Neurologic:  Normal speech and language. No gross focal neurologic deficits are appreciated. No gait instability. Skin:  Skin is warm, dry and intact. No rash noted. Psychiatric: Mood and affect are normal. Speech and behavior are normal.  ____________________________________________   LABS (all labs ordered are listed, but only abnormal results are displayed)  Labs Reviewed - No data to display ____________________________________________  EKG   ____________________________________________  RADIOLOGY  ED MD interpretation:    Official radiology report(s): No results found.  ____________________________________________   PROCEDURES  Procedure(s) performed (including Critical Care):  Procedures   ____________________________________________  INITIAL IMPRESSION / ASSESSMENT AND PLAN / ED COURSE  As part of my medical decision making, I reviewed the following data within the electronic MEDICAL RECORD NUMBER         Jade Allen was evaluated in Emergency Department on 08/24/2019 for the symptoms described in the history of present illness. She was evaluated in the context of the global COVID-19 pandemic, which necessitated consideration that the patient might be at risk for infection with the SARS-CoV-2 virus that causes  COVID-19. Institutional protocols and algorithms that pertain to the evaluation of patients at risk for COVID-19 are in a state of rapid change based on information released by regulatory bodies including the CDC and federal and state organizations. These policies and algorithms were followed during the patient's care in the ED.  Patient presents for antibiotic therapy secondary to being diagnosed with gonorrhea.  Patient given discharge care instructions advised follow-up with [place] as needed.  Patient advised to contact sexual partner to inform him of diagnosis/treatment.      ____________________________________________   FINAL CLINICAL IMPRESSION(S) / ED DIAGNOSES  Final diagnoses:  Gonorrhea     ED Discharge Orders    None       Note:  This document was prepared using Dragon voice recognition software and may include unintentional dictation errors.    Sable Feil, PA-C 08/24/19 1058    Vanessa Barclay, MD 08/26/19 2248

## 2019-09-25 ENCOUNTER — Ambulatory Visit: Payer: Medicaid Other

## 2019-09-26 ENCOUNTER — Ambulatory Visit: Payer: Medicaid Other

## 2020-08-03 ENCOUNTER — Encounter: Payer: Self-pay | Admitting: Emergency Medicine

## 2020-08-03 ENCOUNTER — Emergency Department
Admission: EM | Admit: 2020-08-03 | Discharge: 2020-08-03 | Disposition: A | Discharge status: Home / Self Care | Payer: Medicaid Other | Attending: Emergency Medicine | Admitting: Emergency Medicine

## 2020-08-03 ENCOUNTER — Other Ambulatory Visit: Payer: Self-pay

## 2020-08-03 DIAGNOSIS — Z5321 Procedure and treatment not carried out due to patient leaving prior to being seen by health care provider: Secondary | ICD-10-CM | POA: Insufficient documentation

## 2020-08-03 DIAGNOSIS — R109 Unspecified abdominal pain: Secondary | ICD-10-CM | POA: Insufficient documentation

## 2020-08-03 DIAGNOSIS — K0889 Other specified disorders of teeth and supporting structures: Secondary | ICD-10-CM | POA: Insufficient documentation

## 2020-08-03 LAB — URINALYSIS, COMPLETE (UACMP) WITH MICROSCOPIC
Bacteria, UA: NONE SEEN
Bilirubin Urine: NEGATIVE
Glucose, UA: NEGATIVE mg/dL
Hgb urine dipstick: NEGATIVE
Ketones, ur: NEGATIVE mg/dL
Leukocytes,Ua: NEGATIVE
Nitrite: NEGATIVE
Protein, ur: NEGATIVE mg/dL
Specific Gravity, Urine: 1.012 (ref 1.005–1.030)
pH: 6 (ref 5.0–8.0)

## 2020-08-03 LAB — CBC
HCT: 39.9 % (ref 36.0–46.0)
Hemoglobin: 12.9 g/dL (ref 12.0–15.0)
MCH: 25.5 pg — ABNORMAL LOW (ref 26.0–34.0)
MCHC: 32.3 g/dL (ref 30.0–36.0)
MCV: 78.9 fL — ABNORMAL LOW (ref 80.0–100.0)
Platelets: 327 10*3/uL (ref 150–400)
RBC: 5.06 MIL/uL (ref 3.87–5.11)
RDW: 13.2 % (ref 11.5–15.5)
WBC: 9.7 10*3/uL (ref 4.0–10.5)
nRBC: 0 % (ref 0.0–0.2)

## 2020-08-03 LAB — COMPREHENSIVE METABOLIC PANEL
ALT: 20 U/L (ref 0–44)
AST: 16 U/L (ref 15–41)
Albumin: 4.4 g/dL (ref 3.5–5.0)
Alkaline Phosphatase: 39 U/L (ref 38–126)
Anion gap: 6 (ref 5–15)
BUN: 11 mg/dL (ref 6–20)
CO2: 26 mmol/L (ref 22–32)
Calcium: 9.1 mg/dL (ref 8.9–10.3)
Chloride: 109 mmol/L (ref 98–111)
Creatinine, Ser: 0.66 mg/dL (ref 0.44–1.00)
GFR calc Af Amer: >60 mL/min (ref >60)
GFR calc non Af Amer: >60 mL/min (ref >60)
Glucose, Bld: 98 mg/dL (ref 70–99)
Potassium: 3.7 mmol/L (ref 3.5–5.1)
Sodium: 141 mmol/L (ref 135–145)
Total Bilirubin: 0.9 mg/dL (ref 0.3–1.2)
Total Protein: 7.5 g/dL (ref 6.5–8.1)

## 2020-08-03 LAB — LIPASE, BLOOD: Lipase: 29 U/L (ref 11–51)

## 2020-08-03 LAB — POCT PREGNANCY, URINE: Preg Test, Ur: NEGATIVE

## 2020-08-03 NOTE — ED Triage Notes (Signed)
Pt states that she has known dental issues but now she is also having abdominal pain since yesterday afternoon. Pt is in NAD.

## 2020-09-08 ENCOUNTER — Other Ambulatory Visit: Payer: Self-pay

## 2020-09-08 ENCOUNTER — Encounter: Payer: Self-pay | Admitting: Certified Nurse Midwife

## 2020-09-08 ENCOUNTER — Ambulatory Visit (INDEPENDENT_AMBULATORY_CARE_PROVIDER_SITE_OTHER): Payer: BC Managed Care – PPO | Admitting: Certified Nurse Midwife

## 2020-09-08 VITALS — BP 102/62 | HR 68 | Ht 63.0 in | Wt 230.6 lb

## 2020-09-08 DIAGNOSIS — Z8659 Personal history of other mental and behavioral disorders: Secondary | ICD-10-CM | POA: Diagnosis not present

## 2020-09-08 DIAGNOSIS — O209 Hemorrhage in early pregnancy, unspecified: Secondary | ICD-10-CM

## 2020-09-08 DIAGNOSIS — N926 Irregular menstruation, unspecified: Secondary | ICD-10-CM | POA: Diagnosis not present

## 2020-09-08 DIAGNOSIS — D219 Benign neoplasm of connective and other soft tissue, unspecified: Secondary | ICD-10-CM | POA: Diagnosis not present

## 2020-09-08 LAB — POCT URINE PREGNANCY: Preg Test, Ur: POSITIVE — AB

## 2020-09-08 NOTE — Progress Notes (Signed)
GYN ENCOUNTER NOTE  Subjective:       Jade Allen is a 29 y.o. G59P0000 female here for pregnancy confirmation.   Reports five (5) positive home pregnancy test. This is a desired pregnancy.   Patient reports menses on 07/09/2020 and four (4) days of vaginal bleeding with faint positive urine pregnancy test on 08/05/2020 as well.   Endorses nausea without vomiting, breast tenderness, and intermittent abdominal cramping.   Denies difficulty breathing or respiratory distress, chest pain, dysuria, and leg pain or swelling.   History significant for uterine fibroids and depression.   Taking prenatal gummies daily.    Gynecologic History  Patient's last menstrual period was 08/05/2020 (exact date).  Gestational age: 53 weeks 6 days  Estimated date of birth: 05/12/2023  Contraception: none  Last Pap: N/A  Obstetric History OB History  Gravida Para Term Preterm AB Living  1 0 0 0 0 0  SAB TAB Ectopic Multiple Live Births  0 0 0 0 0    # Outcome Date GA Lbr Len/2nd Weight Sex Delivery Anes PTL Lv  1 Current             Past Medical History:  Diagnosis Date  . ADHD   . Gonorrhea     Past Surgical History:  Procedure Laterality Date  . NO PAST SURGERIES      Current Outpatient Medications on File Prior to Visit  Medication Sig Dispense Refill  . Prenatal MV & Min w/FA-DHA (PRENATAL ADULT GUMMY/DHA/FA PO) Take by mouth.     No current facility-administered medications on file prior to visit.    No Known Allergies  Social History   Socioeconomic History  . Marital status: Single    Spouse name: Not on file  . Number of children: Not on file  . Years of education: Not on file  . Highest education level: Not on file  Occupational History  . Not on file  Tobacco Use  . Smoking status: Former Smoker    Packs/day: 1.00    Types: Cigarettes    Quit date: 09/03/2020    Years since quitting: 0.0  . Smokeless tobacco: Never Used  Vaping Use  . Vaping Use: Former   . Substances: Nicotine, Flavoring  Substance and Sexual Activity  . Alcohol use: Not Currently    Comment: occasional  . Drug use: Yes    Types: Marijuana    Comment: "trying to quit"  . Sexual activity: Yes    Birth control/protection: None  Other Topics Concern  . Not on file  Social History Narrative  . Not on file   Social Determinants of Health   Financial Resource Strain:   . Difficulty of Paying Living Expenses: Not on file  Food Insecurity:   . Worried About Charity fundraiser in the Last Year: Not on file  . Ran Out of Food in the Last Year: Not on file  Transportation Needs:   . Lack of Transportation (Medical): Not on file  . Lack of Transportation (Non-Medical): Not on file  Physical Activity:   . Days of Exercise per Week: Not on file  . Minutes of Exercise per Session: Not on file  Stress:   . Feeling of Stress : Not on file  Social Connections:   . Frequency of Communication with Friends and Family: Not on file  . Frequency of Social Gatherings with Friends and Family: Not on file  . Attends Religious Services: Not on file  . Active Member of  Clubs or Organizations: Not on file  . Attends Archivist Meetings: Not on file  . Marital Status: Not on file  Intimate Partner Violence:   . Fear of Current or Ex-Partner: Not on file  . Emotionally Abused: Not on file  . Physically Abused: Not on file  . Sexually Abused: Not on file    Family History  Problem Relation Age of Onset  . Diabetes Mother   . Lung cancer Father 25  . Cancer Maternal Grandfather 60       Type unknown    The following portions of the patient's history were reviewed and updated as appropriate: allergies, current medications, past family history, past medical history, past social history, past surgical history and problem list.  Review of Systems  ROS negative except as noted above. Information obtained from patient.   Objective:   BP 102/62   Pulse 68   Ht 5\' 3"   (1.6 m)   Wt 230 lb 9.6 oz (104.6 kg)   LMP 08/05/2020 (Exact Date)   BMI 40.85 kg/m    CONSTITUTIONAL: Well-developed, well-nourished female in no acute distress.   PHYSICAL EXAM: Not indicated.   Recent Results (from the past 2160 hour(s))  POCT urine pregnancy     Status: Abnormal   Collection Time: 09/08/20 10:47 AM  Result Value Ref Range   Preg Test, Ur Positive (A) Negative    Assessment:   1. Missed menses  - POCT urine pregnancy - US OB LESS THAN 14 WEEKS WITH OB TRANSVAGINAL; Future - Beta hCG quant (ref lab)  2. History of depression   3. Fibroid  - US OB LESS THAN 14 WEEKS WITH OB TRANSVAGINAL; Future  4. Bleeding in early pregnancy  - US OB LESS THAN 14 WEEKS WITH OB TRANSVAGINAL; Future - Beta hCG quant (ref lab)     Plan:   Labs today, see orders.   First trimester education, see AVS.   Reviewed red flag symptoms and when to call.   RTC x 4 weeks for dating/viability ultrasound & intake.  RTC x 8 weeks for NOB PE or sooner if needed.    Dani Gobble, CNM [facility], [facility] 09/08/20 3:59 PM

## 2020-09-08 NOTE — Patient Instructions (Signed)
Morning Sickness  Morning sickness is when a woman feels nauseous during pregnancy. This nauseous feeling may or may not come with vomiting. It often occurs in the morning, but it can be a problem at any time of day. Morning sickness is most common during the first trimester. In some cases, it may continue throughout pregnancy. Although morning sickness is unpleasant, it is usually harmless unless the woman develops severe and continual vomiting (hyperemesis gravidarum), a condition that requires more intense treatment. What are the causes? The exact cause of this condition is not known, but it seems to be related to normal hormonal changes that occur in pregnancy. What increases the risk? You are more likely to develop this condition if:  You experienced nausea or vomiting before your pregnancy.  You had morning sickness during a previous pregnancy.  You are pregnant with more than one baby, such as twins. What are the signs or symptoms? Symptoms of this condition include:  Nausea.  Vomiting. How is this diagnosed? This condition is usually diagnosed based on your signs and symptoms. How is this treated? In many cases, treatment is not needed for this condition. Making some changes to what you eat may help to control symptoms. Your health care provider may also prescribe or recommend:  Vitamin B6 supplements.  Anti-nausea medicines.  Ginger. Follow these instructions at home: Medicines  Take over-the-counter and prescription medicines only as told by your health care provider. Do not use any prescription, over-the-counter, or herbal medicines for morning sickness without first talking with your health care provider.  Taking multivitamins before getting pregnant can prevent or decrease the severity of morning sickness in most women. Eating and drinking  Eat a piece of dry toast or crackers before getting out of bed in the morning.  Eat 5 or 6 small meals a day.  Eat dry and  bland foods, such as rice or a baked potato. Foods that are high in carbohydrates are often helpful.  Avoid greasy, fatty, and spicy foods.  Have someone cook for you if the smell of any food causes nausea and vomiting.  If you feel nauseous after taking prenatal vitamins, take the vitamins at night or with a snack.  Snack on protein foods between meals if you are hungry. Nuts, yogurt, and cheese are good options.  Drink fluids throughout the day.  Try ginger ale made with real ginger, ginger tea made from fresh grated ginger, or ginger candies. General instructions  Do not use any products that contain nicotine or tobacco, such as cigarettes and e-cigarettes. If you need help quitting, ask your health care provider.  Get an air purifier to keep the air in your house free of odors.  Get plenty of fresh air.  Try to avoid odors that trigger your nausea.  Consider trying these methods to help relieve symptoms: ? Wearing an acupressure wristband. These wristbands are often worn for seasickness. ? Acupuncture. Contact a health care provider if:  Your home remedies are not working and you need medicine.  You feel dizzy or light-headed.  You are losing weight. Get help right away if:  You have persistent and uncontrolled nausea and vomiting.  You faint.  You have severe pain in your abdomen. Summary  Morning sickness is when a woman feels nauseous during pregnancy. This nauseous feeling may or may not come with vomiting.  Morning sickness is most common during the first trimester.  It often occurs in the morning, but it can be a problem at  any time of day.  In many cases, treatment is not needed for this condition. Making some changes to what you eat may help to control symptoms. This information is not intended to replace advice given to you by your health care provider. Make sure you discuss any questions you have with your health care provider. Document Revised:  10/07/2017 Document Reviewed: 11/27/2016 Elsevier Patient Education  [place] Weight Gain During Pregnancy, Adult A certain amount of weight gain during pregnancy is normal and healthy. How much weight you should gain depends on your overall health and a measurement called BMI (body mass index). BMI is an estimate of your body fat based on your height and weight. You can use an Freight forwarder to figure out your BMI, or you can ask your health care provider to calculate it for you at your next visit. Your recommended pregnancy weight gain is based on your pre-pregnancy BMI. General guidelines for a healthy total weight gain during pregnancy are listed below. If your BMI at or before the start of your pregnancy is:  Less than 18.5 (underweight), you should gain 28-40 lb (13-18 kg).  18.5-24.9 (normal weight), you should gain 25-35 lb (11-16 kg).  25-29.9 (overweight), you should gain 15-25 lb (7-11 kg).  30 or higher (obese), you should gain 11-20 lb (5-9 kg). These ranges vary depending on your individual health. If you are carrying more than one baby (multiples), it may be safe to gain more weight than these recommendations. If you gain less weight than recommended, that may be safe as long as your baby is growing and developing normally. How can unhealthy weight gain affect me and my baby? Gaining too much weight during pregnancy can lead to pregnancy complications, such as:  A temporary form of diabetes that develops during pregnancy (gestational diabetes).  High blood pressure during pregnancy and protein in your urine (preeclampsia).  High blood pressure during pregnancy without protein in your urine (gestational hypertension).  Your baby having a high weight at birth, which may: ? Raise your risk of having a more difficult delivery or a surgical delivery (cesarean delivery, or C-section). ? Raise your child's risk of developing obesity during childhood. Not  gaining enough weight can be life-threatening for your baby, and it may raise your baby's chances of:  Being born early (preterm).  Growing more slowly than normal during pregnancy (growth restriction).  Having a low weight at birth. What actions can I take to gain a healthy amount of weight during pregnancy? General instructions  Keep track of your weight gain during pregnancy.  Take over-the-counter and prescription medicines only as told by your health care provider. Take all prenatal supplements as directed.  Keep all health care visits during pregnancy (prenatal visits). These visits are a good time to discuss your weight gain. Your health care provider will weigh you at each visit to make sure you are gaining a healthy amount of weight. Nutrition   Eat a balanced, nutrient-rich diet. Eat plenty of: ? Fruits and vegetables, such as berries and broccoli. ? Whole grains, such as millet, barley, whole-wheat breads and cereals, and oatmeal. ? Low-fat dairy products or non-dairy products such as almond milk or rice milk. ? Protein foods, such as lean meat, chicken, eggs, and legumes (such as peas, beans, soybeans, and lentils).  Avoid foods that are fried or have a lot of fat, salt (sodium), or sugar.  Drink enough fluid to keep your urine pale yellow.  Choose healthy snack and drink options when you are at work or on the go: ? Drink water. Avoid soda, sports drinks, and juices that have added sugar. ? Avoid drinks with caffeine, such as coffee and energy drinks. ? Eat snacks that are high in protein, such as nuts, protein bars, and low-fat yogurt. ? Carry convenient snacks in your purse that do not need refrigeration, such as a pack of trail mix, an apple, or a granola bar.  If you need help improving your diet, work with a health care provider or a diet and nutrition specialist (dietitian). Activity   Exercise regularly, as told by your health care provider. ? If you were  active before becoming pregnant, you may be able to continue your regular fitness activities. ? If you were not active before pregnancy, you may gradually build up to exercising for 30 or more minutes on most days of the week. This may include walking, swimming, or yoga.  Ask your health care provider what activities are safe for you. Talk with your health care provider about whether you may need to be excused from certain school or work activities. Where to find more information Learn more about managing your weight gain during pregnancy from:  American Pregnancy Association: www.americanpregnancy.org  [place] Department of Agriculture pregnancy weight gain calculator: [employer] Summary  Too much weight gain during pregnancy can lead to complications for you and your baby.  Find out your pre-pregnancy BMI to determine how much weight gain is healthy for you.  Eat nutritious foods and stay active.  Keep all of your prenatal visits as told by your health care provider. This information is not intended to replace advice given to you by your health care provider. Make sure you discuss any questions you have with your health care provider. Document Revised: 07/18/2019 Document Reviewed: 07/15/2017 Elsevier Patient Education  [place].   Exercise During Pregnancy Exercise is an important part of being healthy for people of all ages. Exercise improves the function of your heart and lungs and helps you maintain strength, flexibility, and a healthy body weight. Exercise also boosts energy levels and elevates mood. Most women should exercise regularly during pregnancy. In rare cases, women with certain medical conditions or complications may be asked to limit or avoid exercise during pregnancy. How does this affect me? Along with maintaining general strength and flexibility, exercising during pregnancy can help:  Keep strength in muscles that are used during labor and  childbirth.  Decrease low back pain.  Reduce symptoms of depression.  Control weight gain during pregnancy.  Reduce the risk of needing insulin if you develop diabetes during pregnancy.  Decrease the risk of cesarean delivery.  Speed up your recovery after giving birth. How does this affect my baby? Exercise can help you have a healthy pregnancy. Exercise does not cause premature birth. It will not cause your baby to weigh less at birth. What exercises can I do? Many exercises are safe for you to do during pregnancy. Do a variety of exercises that safely increase your heart and breathing rates and help you build and maintain muscle strength. Do exercises exactly as told by your health care provider. You may do these exercises:  Walking or hiking.  Swimming.  Water aerobics.  Riding a stationary bike.  Strength training.  Modified yoga or Pilates. Tell your instructor that you are pregnant. Avoid overstretching, and avoid lying on your back for long periods of time.  Running or jogging. Only choose  this type of exercise if you: ? Ran or jogged regularly before your pregnancy. ? Can run or jog and still talk in complete sentences. What exercises should I avoid? Depending on your level of fitness and whether you exercised regularly before your pregnancy, you may be told to limit high-intensity exercise. You can tell that you are exercising at a high intensity if you are breathing much harder and faster and cannot hold a conversation while exercising. You must avoid:  Contact sports.  Activities that put you at risk for falling on or being hit in the belly, such as downhill skiing, water skiing, surfing, rock climbing, cycling, gymnastics, and horseback riding.  Scuba diving.  Skydiving.  Yoga or Pilates in a room that is heated to high temperatures.  Jogging or running, unless you ran or jogged regularly before your pregnancy. While jogging or running, you should always be  able to talk in full sentences. Do not run or jog so fast that you are unable to have a conversation.  Do not exercise at more than 6,000 feet above sea level (high elevation) if you are not used to exercising at high elevation. How do I exercise in a safe way?   Avoid overheating. Do not exercise in very high temperatures.  Wear loose-fitting, breathable clothes.  Avoid dehydration. Drink enough water before, during, and after exercise to keep your urine pale yellow.  Avoid overstretching. Because of hormone changes during pregnancy, it is easy to overstretch muscles, tendons, and ligaments during pregnancy.  Start slowly and ask your health care provider to recommend the types of exercise that are safe for you.  Do not exercise to lose weight. Follow these instructions at home:  Exercise on most days or all days of the week. Try to exercise for 30 minutes a day, 5 days a week, unless your health care provider tells you not to.  If you actively exercised before your pregnancy and you are healthy, your health care provider may tell you to continue to do moderate to high-intensity exercise.  If you are just starting to exercise or did not exercise much before your pregnancy, your health care provider may tell you to do low to moderate-intensity exercise. Questions to ask your health care provider  Is exercise safe for me?  What are signs that I should stop exercising?  Does my health condition mean that I should not exercise during pregnancy?  When should I avoid exercising during pregnancy? Stop exercising and contact a health care provider if: You have any unusual symptoms, such as:  Mild contractions of the uterus or cramps in the abdomen.  Dizziness that does not go away when you rest. Stop exercising and get help right away if: You have any unusual symptoms, such as:  Sudden, severe pain in your low back or your belly.  Mild contractions of the uterus or cramps in the  abdomen that do not improve with rest and drinking fluids.  Chest pain.  Bleeding or fluid leaking from your vagina.  Shortness of breath. These symptoms may represent a serious problem that is an emergency. Do not wait to see if the symptoms will go away. Get medical help right away. Call your local emergency services (911 in the [place] Do not drive yourself to the hospital. Summary  Most women should exercise regularly throughout pregnancy. In rare cases, women with certain medical conditions or complications may be asked to limit or avoid exercise during pregnancy.  Do not exercise to lose  weight during pregnancy.  Your health care provider will tell you what level of physical activity is right for you.  Stop exercising and contact a health care provider if you have mild contractions of the uterus or cramps in the abdomen. Get help right away if these contractions or cramps do not improve with rest and drinking fluids.  Stop exercising and get help right away if you have sudden, severe pain in your low back or belly, chest pain, shortness of breath, or bleeding or leaking of fluid from your vagina. This information is not intended to replace advice given to you by your health care provider. Make sure you discuss any questions you have with your health care provider. Document Revised: 02/15/2019 Document Reviewed: 11/29/2018 Elsevier Patient Education  [place].   Common Medications Safe in Pregnancy  Acne:      Constipation:  Benzoyl Peroxide     Colace  Clindamycin      Dulcolax Suppository  Topica Erythromycin     Fibercon  Salicylic Acid      Metamucil         Miralax AVOID:        Senakot   Accutane    Cough:  Retin-A       Cough Drops  Tetracycline      Phenergan w/ Codeine if Rx  Minocycline      Robitussin (Plain &  DM)  Antibiotics:     Crabs/Lice:  Ceclor       RID  Cephalosporins    AVOID:  E-Mycins      Kwell  Keflex  Macrobid/Macrodantin   Diarrhea:  Penicillin      Kao-Pectate  Zithromax      Imodium AD         PUSH FLUIDS AVOID:       Cipro     Fever:  Tetracycline      Tylenol (Regular or Extra  Minocycline       Strength)  Levaquin      Extra Strength-Do not          Exceed 8 tabs/24 hrs Caffeine:        <242m/day (equiv. To 1 cup of coffee or  approx. 3 12 oz sodas)         Gas: Cold/Hayfever:       Gas-X  Benadryl      Mylicon  Claritin       Phazyme  **Claritin-D        Chlor-Trimeton    Headaches:  Dimetapp      ASA-Free Excedrin  Drixoral-Non-Drowsy     Cold Compress  Mucinex (Guaifenasin)     Tylenol (Regular or Extra  Sudafed/Sudafed-12 Hour     Strength)  **Sudafed PE Pseudoephedrine   Tylenol Cold & Sinus     Vicks Vapor Rub  Zyrtec  **AVOID if Problems With Blood Pressure         Heartburn: Avoid lying down for at least 1 hour after meals  Aciphex      Maalox     Rash:  Milk of Magnesia     Benadryl    Mylanta       1% Hydrocortisone Cream  Pepcid  Pepcid Complete   Sleep Aids:  Prevacid      Ambien   Prilosec       Benadryl  Rolaids       Chamomile Tea  Tums (Limit 4/day)     Unisom  Tylenol PM         Warm milk-add vanilla or  Hemorrhoids:       Sugar for taste  Anusol/Anusol H.C.  (RX: Analapram 2.5%)  Sugar Substitutes:  Hydrocortisone OTC     Ok in moderation  Preparation H      Tucks        Vaseline lotion applied to tissue with wiping    Herpes:     Throat:  Acyclovir      Oragel  Famvir  Valtrex     Vaccines:         Flu Shot Leg Cramps:       *Gardasil  Benadryl      Hepatitis A         Hepatitis B Nasal Spray:       Pneumovax  Saline Nasal Spray     Polio Booster         Tetanus Nausea:       Tuberculosis test or PPD  Vitamin B6 25 mg TID   AVOID:    Dramamine      *Gardasil  Emetrol       Live Poliovirus  Ginger  Root 250 mg QID    MMR (measles, mumps &  High Complex Carbs @ Bedtime    rebella)  Sea Bands-Accupressure    Varicella (Chickenpox)  Unisom 1/2 tab TID     *No known complications           If received before Pain:         Known pregnancy;   Darvocet       Resume series after  Lortab        Delivery  Percocet    Yeast:   Tramadol      Femstat  Tylenol 3      Gyne-lotrimin  Ultram       Monistat  Vicodin           MISC:         All Sunscreens           Hair Coloring/highlights          Insect Repellant's          (Including DEET)         Mystic Tans   First Trimester of Pregnancy  The first trimester of pregnancy is from week 1 until the end of week 13 (months 1 through 3). During this time, your baby will begin to develop inside you. At 6-8 weeks, the eyes and face are formed, and the heartbeat can be seen on ultrasound. At the end of 12 weeks, all the baby's organs are formed. Prenatal care is all the medical care you receive before the birth of your baby. Make sure you get good prenatal care and follow all of your doctor's instructions. Follow these instructions at home: Medicines  Take over-the-counter and prescription medicines only as told by your doctor. Some medicines are safe and some medicines are not safe during pregnancy.  Take a prenatal vitamin that contains at least 600 micrograms (mcg) of folic acid.  If you have trouble pooping (constipation), take medicine that will make your stool soft (stool softener) if your doctor approves. Eating and drinking   Eat regular, healthy meals.  Your doctor will tell you the amount of weight gain that is right for you.  Avoid raw meat and uncooked cheese.  If you feel sick to your stomach (nauseous) or throw up (vomit): ? Eat 4 or  5 small meals a day instead of 3 large meals. ? Try eating a few soda crackers. ? Drink liquids between meals instead of during meals.  To prevent constipation: ? Eat foods that are high in  fiber, like fresh fruits and vegetables, whole grains, and beans. ? Drink enough fluids to keep your pee (urine) clear or pale yellow. Activity  Exercise only as told by your doctor. Stop exercising if you have cramps or pain in your lower belly (abdomen) or low back.  Do not exercise if it is too hot, too humid, or if you are in a place of great height (high altitude).  Try to avoid standing for long periods of time. Move your legs often if you must stand in one place for a long time.  Avoid heavy lifting.  Wear low-heeled shoes. Sit and stand up straight.  You can have sex unless your doctor tells you not to. Relieving pain and discomfort  Wear a good support bra if your breasts are sore.  Take warm water baths (sitz baths) to soothe pain or discomfort caused by hemorrhoids. Use hemorrhoid cream if your doctor says it is okay.  Rest with your legs raised if you have leg cramps or low back pain.  If you have puffy, bulging veins (varicose veins) in your legs: ? Wear support hose or compression stockings as told by your doctor. ? Raise (elevate) your feet for 15 minutes, 3-4 times a day. ? Limit salt in your food. Prenatal care  Schedule your prenatal visits by the twelfth week of pregnancy.  Write down your questions. Take them to your prenatal visits.  Keep all your prenatal visits as told by your doctor. This is important. Safety  Wear your seat belt at all times when driving.  Make a list of emergency phone numbers. The list should include numbers for family, friends, the hospital, and police and fire departments. General instructions  Ask your doctor for a referral to a local prenatal class. Begin classes no later than at the start of month 6 of your pregnancy.  Ask for help if you need counseling or if you need help with nutrition. Your doctor can give you advice or tell you where to go for help.  Do not use hot tubs, steam rooms, or saunas.  Do not douche or use  tampons or scented sanitary pads.  Do not cross your legs for long periods of time.  Avoid all herbs and alcohol. Avoid drugs that are not approved by your doctor.  Do not use any tobacco products, including cigarettes, chewing tobacco, and electronic cigarettes. If you need help quitting, ask your doctor. You may get counseling or other support to help you quit.  Avoid cat litter boxes and soil used by cats. These carry germs that can cause birth defects in the baby and can cause a loss of your baby (miscarriage) or stillbirth.  Visit your dentist. At home, brush your teeth with a soft toothbrush. Be gentle when you floss. Contact a doctor if:  You are dizzy.  You have mild cramps or pressure in your lower belly.  You have a nagging pain in your belly area.  You continue to feel sick to your stomach, you throw up, or you have watery poop (diarrhea).  You have a bad smelling fluid coming from your vagina.  You have pain when you pee (urinate).  You have increased puffiness (swelling) in your face, hands, legs, or ankles. Get help right away if:  You have a fever.  You are leaking fluid from your vagina.  You have spotting or bleeding from your vagina.  You have very bad belly cramping or pain.  You gain or lose weight rapidly.  You throw up blood. It may look like coffee grounds.  You are around people who have [place] measles, fifth disease, or chickenpox.  You have a very bad headache.  You have shortness of breath.  You have any kind of trauma, such as from a fall or a car accident. Summary  The first trimester of pregnancy is from week 1 until the end of week 13 (months 1 through 3).  To take care of yourself and your unborn baby, you will need to eat healthy meals, take medicines only if your doctor tells you to do so, and do activities that are safe for you and your baby.  Keep all follow-up visits as told by your doctor. This is important as your doctor will  have to ensure that your baby is healthy and growing well. This information is not intended to replace advice given to you by your health care provider. Make sure you discuss any questions you have with your health care provider. Document Revised: 02/15/2019 Document Reviewed: 11/02/2016 Elsevier Patient Education  2020 [employer].

## 2020-09-09 ENCOUNTER — Telehealth: Payer: Self-pay

## 2020-09-09 LAB — BETA HCG QUANT (REF LAB): hCG Quant: 1220 m[IU]/mL

## 2020-09-09 NOTE — Telephone Encounter (Signed)
mychart message sent

## 2020-09-09 NOTE — Telephone Encounter (Signed)
Patient called in stating that she got her test results from [facility] and that she is not understanding them and would like her provider to give her a call to go over them with her.   Could you please advise?

## 2020-09-10 ENCOUNTER — Telehealth: Payer: Self-pay

## 2020-09-10 NOTE — Telephone Encounter (Signed)
Pt called in and stated that she is bleeding and it started today. The pt said that its a light pink the pt denies being in any pain. The pt is requesting to talk to a nurse. I went to talk to Ivin Booty and told her she wanted me to ask Deneise Lever. Deneise Lever said that the pt needs ultrasound and labs this week or she can go to the ED. I told the pt what Deneise Lever said and the pt said she will go to ED.

## 2020-09-11 ENCOUNTER — Telehealth: Payer: Self-pay | Admitting: Certified Nurse Midwife

## 2020-09-11 DIAGNOSIS — A5901 Trichomonal vulvovaginitis: Secondary | ICD-10-CM

## 2020-09-11 MED ORDER — METRONIDAZOLE 500 MG PO TABS: ORAL_TABLET | ORAL | 0 refills | Status: DC

## 2020-09-11 NOTE — Telephone Encounter (Signed)
Telephone call to patient, verified full name and date of birth.   Rx Flagyl, see orders to treatment trichomonas vaginitis.   Reviewed red flag symptoms and when to call.   Patient scheduled tomorrow with me for ER follow up visit.    Dani Gobble, CNM [facility], [facility] 09/11/20 2:33 PM

## 2020-09-11 NOTE — Telephone Encounter (Signed)
Pt called in and stated that she went to the ED last night. The pt was given amoxicillin for a tooth infection. The pt also tested positive for  Trichomonas, the pt was wanting to know will the amoxicillin help with the Trichomonas.   I told the pt I will send a message to the nurse and that someone will be in contact with her. The pt was just worried about her baby. Please advise the pt stated that she uses [facility] on [address].

## 2020-09-12 ENCOUNTER — Encounter: Payer: BC Managed Care – PPO | Admitting: Certified Nurse Midwife

## 2020-09-14 ENCOUNTER — Emergency Department: Payer: BC Managed Care – PPO

## 2020-09-14 ENCOUNTER — Emergency Department
Admission: EM | Admit: 2020-09-14 | Discharge: 2020-09-14 | Disposition: A | Discharge status: Home / Self Care | Payer: BC Managed Care – PPO | Attending: Emergency Medicine | Admitting: Emergency Medicine

## 2020-09-14 DIAGNOSIS — O208 Other hemorrhage in early pregnancy: Secondary | ICD-10-CM | POA: Diagnosis not present

## 2020-09-14 DIAGNOSIS — Z3A01 Less than 8 weeks gestation of pregnancy: Secondary | ICD-10-CM | POA: Diagnosis not present

## 2020-09-14 DIAGNOSIS — O418X10 Other specified disorders of amniotic fluid and membranes, first trimester, not applicable or unspecified: Secondary | ICD-10-CM

## 2020-09-14 DIAGNOSIS — Z87891 Personal history of nicotine dependence: Secondary | ICD-10-CM | POA: Insufficient documentation

## 2020-09-14 DIAGNOSIS — O209 Hemorrhage in early pregnancy, unspecified: Secondary | ICD-10-CM | POA: Diagnosis present

## 2020-09-14 DIAGNOSIS — O468X1 Other antepartum hemorrhage, first trimester: Secondary | ICD-10-CM

## 2020-09-14 LAB — COMPREHENSIVE METABOLIC PANEL
ALT: 32 U/L (ref 0–44)
AST: 24 U/L (ref 15–41)
Albumin: 4.5 g/dL (ref 3.5–5.0)
Alkaline Phosphatase: 46 U/L (ref 38–126)
Anion gap: 9 (ref 5–15)
BUN: 8 mg/dL (ref 6–20)
CO2: 24 mmol/L (ref 22–32)
Calcium: 9.6 mg/dL (ref 8.9–10.3)
Chloride: 102 mmol/L (ref 98–111)
Creatinine, Ser: 0.7 mg/dL (ref 0.44–1.00)
GFR, Estimated: >60 mL/min (ref >60)
Glucose, Bld: 90 mg/dL (ref 70–99)
Potassium: 4 mmol/L (ref 3.5–5.1)
Sodium: 135 mmol/L (ref 135–145)
Total Bilirubin: 0.4 mg/dL (ref 0.3–1.2)
Total Protein: 7.7 g/dL (ref 6.5–8.1)

## 2020-09-14 LAB — URINALYSIS, COMPLETE (UACMP) WITH MICROSCOPIC
Bacteria, UA: NONE SEEN
Bilirubin Urine: NEGATIVE
Glucose, UA: NEGATIVE mg/dL
Ketones, ur: NEGATIVE mg/dL
Nitrite: NEGATIVE
Protein, ur: NEGATIVE mg/dL
Specific Gravity, Urine: 1.03 (ref 1.005–1.030)
pH: 5 (ref 5.0–8.0)

## 2020-09-14 LAB — ABO/RH: ABO/RH(D): A POS

## 2020-09-14 LAB — CBC
HCT: 40.4 % (ref 36.0–46.0)
Hemoglobin: 13.2 g/dL (ref 12.0–15.0)
MCH: 25.5 pg — ABNORMAL LOW (ref 26.0–34.0)
MCHC: 32.7 g/dL (ref 30.0–36.0)
MCV: 78 fL — ABNORMAL LOW (ref 80.0–100.0)
Platelets: 415 10*3/uL — ABNORMAL HIGH (ref 150–400)
RBC: 5.18 MIL/uL — ABNORMAL HIGH (ref 3.87–5.11)
RDW: 13.5 % (ref 11.5–15.5)
WBC: 9.5 10*3/uL (ref 4.0–10.5)
nRBC: 0 % (ref 0.0–0.2)

## 2020-09-14 LAB — POC URINE PREG, ED: Preg Test, Ur: POSITIVE — AB

## 2020-09-14 LAB — HCG, QUANTITATIVE, PREGNANCY: hCG, Beta Chain, Quant, S: 11764 m[IU]/mL — ABNORMAL HIGH (ref <5)

## 2020-09-14 NOTE — ED Notes (Addendum)
Pt in room, EDP provider at bedside. Pt states had "light bleeding" about 3d ago and was seen at [facility] and discharged. Yesterday 11/6 felt more vaginal bleeding and this time it was dark red/brownish with no clots. Bleeding stopped but this morning noticed small amt pinkish discharge in pad. Pt is 5+[redacted] wks pregnant per LNMP of 9/28 and had telehealth OB appt recently where EDD was established. Denies PMH or meds except currently taking amox for tooth poblem. Pt denies current alcohol, tobacco or drug use but was prior smoker and stopped when became pregnant. Denies pain/burning with urination.

## 2020-09-14 NOTE — Discharge Instructions (Addendum)
Your Ultrasound today showed: 1. Probable early intrauterine gestational sac containing a yolk sac, but no fetal pole, or cardiac activity yet visualized. Recommend follow-up quantitative B-HCG levels and follow-up US in 14 days to assess viability. This recommendation follows SRU consensus guidelines: Diagnostic Criteria for Nonviable Pregnancy Early in the First Trimester. [employer] Med 2013; 271:2929-09. 2. Moderate size, 5 cc, subchorionic hemorrhage.

## 2020-09-14 NOTE — ED Notes (Signed)
Paper consent for discharge signed. 

## 2020-09-14 NOTE — ED Provider Notes (Signed)
[facility] Emergency Department Provider Note  ____________________________________________   First MD Initiated Contact with Patient 09/14/20 1124     (approximate)  I have reviewed the triage vital signs and the nursing notes.   HISTORY  Chief Complaint Vaginal Bleeding   HPI Jade Allen is a 29 y.o. female with a past medical history of ADHD and fibroids who presents for assessment of first trimester vaginal bleeding.  Patient states she is calculated she is approximately 5 weeks 5 days by her last LMP.  She states she had some bleeding about a week ago that consisted of some spotting and a little bit more 3 days ago.  She states she went to [facility] on 11/3 where she had an ultrasound and exam done that were reassuring.  Her states she started bleeding again yesterday and then had some more bleeding today at work.  She states she is not currently actively bleeding she denies any pain or contractions, burning with urination, back pain, blood in her stool, blood in her urine, fevers, chills, chest pain, cough, shortness of breath, rash, or other acute symptoms.  She does note she is currently on amoxicillin for infected tooth which is scheduled to have removed.  Denies EtOH use, with the drug use, or tobacco abuse.  States he quit smoking and she plans with pregnant currently only using some nicotine replacement therapy gum.  She notes this is the first time she is ever been pregnant.  She is not yet had a first OB/GYN appointment yet.      Past Medical History:  Diagnosis Date  . ADHD   . Gonorrhea     Patient Active Problem List   Diagnosis Date Noted  . Fibroid 07/31/2019    Past Surgical History:  Procedure Laterality Date  . NO PAST SURGERIES      Prior to Admission medications   Medication Sig Start Date End Date Taking? Authorizing Provider  metroNIDAZOLE (FLAGYL) 500 MG tablet Take two tablets by mouth twice a day, for one day.  Or you can take  all four tablets at once if you can tolerate it. 09/11/20   Lawhorn, Lara Mulch, CNM  Prenatal MV & Min w/FA-DHA (PRENATAL ADULT GUMMY/DHA/FA PO) Take by mouth.    [provider]    Allergies Patient has no known allergies.  Family History  Problem Relation Age of Onset  . Diabetes Mother   . Lung cancer Father 53  . Cancer Maternal Grandfather 72       Type unknown    Social History Social History   Tobacco Use  . Smoking status: Former Smoker    Packs/day: 1.00    Types: Cigarettes    Quit date: 09/03/2020    Years since quitting: 0.0  . Smokeless tobacco: Never Used  Vaping Use  . Vaping Use: Former  . Substances: Nicotine, Flavoring  Substance Use Topics  . Alcohol use: Not Currently    Comment: occasional  . Drug use: Yes    Types: Marijuana    Comment: "trying to quit"    Review of Systems  Review of Systems  Constitutional: Negative for chills and fever.  HENT: Negative for sore throat.   Eyes: Negative for pain.  Respiratory: Negative for cough and stridor.   Cardiovascular: Negative for chest pain.  Gastrointestinal: Negative for vomiting.  Genitourinary: Negative for dysuria.  Skin: Negative for rash.  Neurological: Negative for seizures, loss of consciousness and headaches.  Psychiatric/Behavioral: Negative for suicidal  ideas.  All other systems reviewed and are negative.     ____________________________________________   PHYSICAL EXAM:  VITAL SIGNS: ED Triage Vitals  Enc Vitals Group     BP 09/14/20 0900 (S) 133/72     Pulse Rate 09/14/20 0900 70     Resp 09/14/20 0900 18     Temp 09/14/20 0900 (S) 99.1 F (37.3 C)     Temp Source 09/14/20 0900 Oral     SpO2 09/14/20 0900 100 %     Weight 09/14/20 0910 230 lb (104.3 kg)     Height 09/14/20 0910 5\' 3"  (1.6 m)     Head Circumference --      Peak Flow --      Pain Score 09/14/20 0910 0     Pain Loc --      Pain Edu? --      Excl. in [place]? --    Vitals:   09/14/20 0900  09/14/20 1132  BP: (S) 133/72 122/82  Pulse: 70 82  Resp: 18 16  Temp: (S) 99.1 F (37.3 C)   SpO2: 100% 100%   Physical Exam Vitals and nursing note reviewed.  Constitutional:      General: She is not in acute distress.    Appearance: She is well-developed. She is obese.  HENT:     Head: Normocephalic and atraumatic.     Right Ear: External ear normal.     Left Ear: External ear normal.     Nose: Nose normal.     Mouth/Throat:     Mouth: Mucous membranes are moist.  Eyes:     Conjunctiva/sclera: Conjunctivae normal.  Cardiovascular:     Rate and Rhythm: Normal rate and regular rhythm.     Heart sounds: No murmur heard.   Pulmonary:     Effort: Pulmonary effort is normal. No respiratory distress.     Breath sounds: Normal breath sounds.  Abdominal:     Palpations: Abdomen is soft.     Tenderness: There is no abdominal tenderness.  Musculoskeletal:     Cervical back: Neck supple.     Right lower leg: No edema.     Left lower leg: No edema.  Skin:    General: Skin is warm and dry.  Neurological:     Mental Status: She is alert and oriented to person, place, and time.  Psychiatric:        Mood and Affect: Mood normal.      ___________________________________________   LABS (all labs ordered are listed, but only abnormal results are displayed)  Labs Reviewed  CBC - Abnormal; Notable for the following components:      Result Value   RBC 5.18 (*)    MCV 78.0 (*)    MCH 25.5 (*)    Platelets 415 (*)    All other components within normal limits  URINALYSIS, COMPLETE (UACMP) WITH MICROSCOPIC - Abnormal; Notable for the following components:   Color, Urine YELLOW (*)    APPearance HAZY (*)    Hgb urine dipstick LARGE (*)    Leukocytes,Ua TRACE (*)    All other components within normal limits  HCG, QUANTITATIVE, PREGNANCY - Abnormal; Notable for the following components:   hCG, Beta Chain, Quant, S 11,764 (*)    All other components within normal limits  POC  URINE PREG, ED - Abnormal; Notable for the following components:   Preg Test, Ur POSITIVE (*)    All other components within normal limits  WET PREP,  GENITAL  CHLAMYDIA/NGC RT PCR ([facility] ONLY)  COMPREHENSIVE METABOLIC PANEL  ABO/RH   ____________________________________________   ____________________________________________  RADIOLOGY    Official radiology report(s): US OB LESS THAN 14 WEEKS WITH OB TRANSVAGINAL  Result Date: 09/14/2020 CLINICAL DATA:  Vaginal bleeding. EXAM: OBSTETRIC <14 WK [place] AND TRANSVAGINAL OB US TECHNIQUE: Both transabdominal and transvaginal ultrasound examinations were performed for complete evaluation of the gestation as well as the maternal uterus, adnexal regions, and pelvic cul-de-sac. Transvaginal technique was performed to assess early pregnancy. COMPARISON:  None. FINDINGS: Intrauterine gestational sac: Single Yolk sac:  Yes Embryo:  Not visualize MSD: 11.4 mm   5 w   6 d Subchorionic hemorrhage:  None visualized. Maternal uterus/adnexae: Subchorionic hemorrhage: Moderate size subchorionic hemorrhage identified. This measures 3.6 by 2.2 x 1.2 cm (volume = 5 cm^3). Right ovary: Appears normal. Dominant cyst measures 2.3 x 1.6 x 1.7 cm Left ovary: Appears normal containing a dominant follicle measuring 2.9 x 1.9 x 2.3 cm. Other :Right uterine fibroid measures 3.7 x 3.4 x 3.7 cm Free fluid:  None IMPRESSION: 1. Probable early intrauterine gestational sac containing a yolk sac, but no fetal pole, or cardiac activity yet visualized. Recommend follow-up quantitative B-HCG levels and follow-up US in 14 days to assess viability. This recommendation follows SRU consensus guidelines: Diagnostic Criteria for Nonviable Pregnancy Early in the First Trimester. [employer] Med 2013; 976:7341-93. 2. Moderate size, 5 cc, subchorionic hemorrhage. Electronically Signed   By: Kerby Moors M.D.   On: 09/14/2020 11:40     ____________________________________________   PROCEDURES  Procedure(s) performed (including Critical Care):  Procedures   ____________________________________________   INITIAL IMPRESSION / ASSESSMENT AND PLAN / ED COURSE        Patient presents with [place] to history exam for assessment of some bleeding in the setting of trimester pregnancy.  Patient is afebrile he medically stable arrival.  Exam as above.  I was able to review patient's ultrasound results that are reported in care everywhere from her visit on 11/3 which were remarkable for "Low lying gestational sac within the isthmus of the uterus, which measures up to 6 mm in greatest transverse diameter.  Indeterminate echogenic structure within the gestational sac may represent a wall of the yolk sac versus CRL of an embryo. Differential diagnosis includes early intrauterine pregnancy, failed pregnancy, and ectopic pregnancy. Recommend clinical correlation, serial quantitative beta HCGs, ectopic precautions, and followup ultrasound as clinically appropriate.  2.4 cm right ovarian corpus luteum cyst."   Given patient has some new bleeding today it is possible she has had miscarriage versus threatened versus incomplete abortion.  Also possible she has a subchorionic hemorrhage.  Ultrasound shows closed os does show some chronic hemorrhage.  Given reported low-lying implantation on previous ultrasound will defer pelvic at this time due to possible harm from manipulation of the cervix.  CBC obtained today shows normal hemoglobin and CMP shows no significant ultralight or metabolic derangements.  Urine has some blood but does not appear infected and has no bacteria.  hCG is appropriately uptrending 11,000.  Patient is Rh+ and RhoGam is not indicated this time.  Advised patient of above-noted ultrasound findings and radiology recommendation to have repeat study done in 14 days as well as repeat hCG level at that time.  Advised patient of  above finding of subchorionic hemorrhage and expectant management.  Advised patient to follow-up with her gynecologist.  Patient voiced understanding and agreement this plan.  Patient discharged able condition.  Strict precautions  as discussed  ____________________________________________   FINAL CLINICAL IMPRESSION(S) / ED DIAGNOSES  Final diagnoses:  Vaginal bleeding in pregnancy, first trimester  Subchorionic hematoma in first trimester, single or unspecified fetus    Medications - No data to display   ED Discharge Orders    None       Note:  This document was prepared using Dragon voice recognition software and may include unintentional dictation errors.   Lucrezia Starch, MD 09/14/20 1159

## 2020-09-14 NOTE — ED Triage Notes (Signed)
Patient to ED for vaginal bleeding in a setting of [redacted] weeks pregnant. APproximate EDC by [facility] 05/12/2021.

## 2020-09-19 ENCOUNTER — Emergency Department
Admission: EM | Admit: 2020-09-19 | Discharge: 2020-09-19 | Disposition: A | Discharge status: Home / Self Care | Payer: BC Managed Care – PPO | Attending: Emergency Medicine | Admitting: Emergency Medicine

## 2020-09-19 ENCOUNTER — Other Ambulatory Visit: Payer: Self-pay

## 2020-09-19 DIAGNOSIS — Z87891 Personal history of nicotine dependence: Secondary | ICD-10-CM | POA: Insufficient documentation

## 2020-09-19 DIAGNOSIS — O4690 Antepartum hemorrhage, unspecified, unspecified trimester: Secondary | ICD-10-CM

## 2020-09-19 DIAGNOSIS — O209 Hemorrhage in early pregnancy, unspecified: Secondary | ICD-10-CM | POA: Insufficient documentation

## 2020-09-19 DIAGNOSIS — Z3A01 Less than 8 weeks gestation of pregnancy: Secondary | ICD-10-CM | POA: Insufficient documentation

## 2020-09-19 LAB — CBC WITH DIFFERENTIAL/PLATELET
Abs Immature Granulocytes: 0.03 10*3/uL (ref 0.00–0.07)
Basophils Absolute: 0 10*3/uL (ref 0.0–0.1)
Basophils Relative: 0 %
Eosinophils Absolute: 0.4 10*3/uL (ref 0.0–0.5)
Eosinophils Relative: 4 %
HCT: 38.2 % (ref 36.0–46.0)
Hemoglobin: 12.7 g/dL (ref 12.0–15.0)
Immature Granulocytes: 0 %
Lymphocytes Relative: 27 %
Lymphs Abs: 2.9 10*3/uL (ref 0.7–4.0)
MCH: 25.7 pg — ABNORMAL LOW (ref 26.0–34.0)
MCHC: 33.2 g/dL (ref 30.0–36.0)
MCV: 77.2 fL — ABNORMAL LOW (ref 80.0–100.0)
Monocytes Absolute: 1 10*3/uL (ref 0.1–1.0)
Monocytes Relative: 9 %
Neutro Abs: 6.4 10*3/uL (ref 1.7–7.7)
Neutrophils Relative %: 60 %
Platelets: 354 10*3/uL (ref 150–400)
RBC: 4.95 MIL/uL (ref 3.87–5.11)
RDW: 13.4 % (ref 11.5–15.5)
WBC: 10.9 10*3/uL — ABNORMAL HIGH (ref 4.0–10.5)
nRBC: 0 % (ref 0.0–0.2)

## 2020-09-19 LAB — ABO/RH: ABO/RH(D): A POS

## 2020-09-19 LAB — COMPREHENSIVE METABOLIC PANEL
ALT: 37 U/L (ref 0–44)
AST: 31 U/L (ref 15–41)
Albumin: 4 g/dL (ref 3.5–5.0)
Alkaline Phosphatase: 37 U/L — ABNORMAL LOW (ref 38–126)
Anion gap: 10 (ref 5–15)
BUN: 11 mg/dL (ref 6–20)
CO2: 22 mmol/L (ref 22–32)
Calcium: 9.3 mg/dL (ref 8.9–10.3)
Chloride: 103 mmol/L (ref 98–111)
Creatinine, Ser: 0.55 mg/dL (ref 0.44–1.00)
GFR, Estimated: >60 mL/min (ref >60)
Glucose, Bld: 99 mg/dL (ref 70–99)
Potassium: 3.5 mmol/L (ref 3.5–5.1)
Sodium: 135 mmol/L (ref 135–145)
Total Bilirubin: 0.6 mg/dL (ref 0.3–1.2)
Total Protein: 6.9 g/dL (ref 6.5–8.1)

## 2020-09-19 LAB — HCG, QUANTITATIVE, PREGNANCY: hCG, Beta Chain, Quant, S: 34152 m[IU]/mL — ABNORMAL HIGH (ref <5)

## 2020-09-19 LAB — POC URINE PREG, ED: Preg Test, Ur: POSITIVE — AB

## 2020-09-19 NOTE — Discharge Instructions (Signed)
Please follow-up with OB/GYN as scheduled.  Return to the emergency department for any significant increase in bleeding, large clots/tissue passage, or any other symptom personally concerning to yourself.

## 2020-09-19 NOTE — ED Provider Notes (Signed)
[facility] Emergency Department Provider Note  Time seen: 8:59 PM  I have reviewed the triage vital signs and the nursing notes.   HISTORY  Chief Complaint Vaginal Bleeding   HPI Jade Allen is a 29 y.o. female with a past medical history of ADHD presents to the emergency department for continued vaginal bleeding/spotting.  According to the patient for the past week or so she has had vaginal bleeding/spotting.  This is the patient's first pregnancy.  She was seen in the emergency department 5 days ago for the same had an ultrasound at that time showing a 5-week 6-day gestation with subchorionic hemorrhage.  Patient has continued to have intermittent bleeding and spotting she was concerned so she came to the emergency department for evaluation.  Denies any clots or tissue passage.  Denies any abdominal pain or cramping.  Patient has an appointment with her OB/GYN 09/24/2020.   Past Medical History:  Diagnosis Date  . ADHD   . Gonorrhea     Patient Active Problem List   Diagnosis Date Noted  . Fibroid 07/31/2019    Past Surgical History:  Procedure Laterality Date  . NO PAST SURGERIES      Prior to Admission medications   Medication Sig Start Date End Date Taking? Authorizing Provider  metroNIDAZOLE (FLAGYL) 500 MG tablet Take two tablets by mouth twice a day, for one day.  Or you can take all four tablets at once if you can tolerate it. 09/11/20   Lawhorn, Lara Mulch, CNM  Prenatal MV & Min w/FA-DHA (PRENATAL ADULT GUMMY/DHA/FA PO) Take by mouth.    [provider]    No Known Allergies  Family History  Problem Relation Age of Onset  . Diabetes Mother   . Lung cancer Father 61  . Cancer Maternal Grandfather 43       Type unknown    Social History Social History   Tobacco Use  . Smoking status: Former Smoker    Packs/day: 1.00    Types: Cigarettes    Quit date: 09/03/2020    Years since quitting: 0.0  . Smokeless tobacco:  Never Used  Vaping Use  . Vaping Use: Former  . Substances: Nicotine, Flavoring  Substance Use Topics  . Alcohol use: Not Currently    Comment: occasional  . Drug use: Yes    Types: Marijuana    Comment: "trying to quit"    Review of Systems Constitutional: Negative for fever. Cardiovascular: Negative for chest pain. Respiratory: Negative for shortness of breath. Gastrointestinal: Negative for abdominal pain Genitourinary: Vaginal spotting. Musculoskeletal: Negative for musculoskeletal complaints Neurological: Negative for headache All other ROS negative  ____________________________________________   PHYSICAL EXAM:  VITAL SIGNS: ED Triage Vitals  Enc Vitals Group     BP 09/19/20 1824 124/75     Pulse Rate 09/19/20 1824 71     Resp 09/19/20 1824 16     Temp 09/19/20 1824 99.1 F (37.3 C)     Temp Source 09/19/20 1824 Oral     SpO2 09/19/20 1824 98 %     Weight 09/19/20 1825 230 lb (104.3 kg)     Height 09/19/20 1825 5\' 3"  (1.6 m)     Head Circumference --      Peak Flow --      Pain Score 09/19/20 1824 1     Pain Loc --      Pain Edu? --      Excl. in [place]? --     Constitutional:  Alert and oriented. Well appearing and in no distress. Eyes: Normal exam ENT      Head: Normocephalic and atraumatic.      Mouth/Throat: Mucous membranes are moist. Cardiovascular: Normal rate, regular rhythm.  Respiratory: Normal respiratory effort without tachypnea nor retractions. Breath sounds are clear Gastrointestinal: Soft and nontender. No distention.   Musculoskeletal: Nontender with normal range of motion in all extremities.  Neurologic:  Normal speech and language. No gross focal neurologic deficits Skin:  Skin is warm, dry and intact.  Psychiatric: Mood and affect are normal.     INITIAL IMPRESSION / ASSESSMENT AND PLAN / ED COURSE  Pertinent labs & imaging results that were available during my care of the patient were reviewed by me and considered in my medical  decision making (see chart for details).   Patient presents emergency department for continued vaginal bleeding/spotting.  I reviewed the patient's ultrasound from 5 days ago showing 5-week 6-day gestational sac with subchorionic hemorrhage.  Beta hCG increased from 11,030 4000 which seems to be an appropriate increase for a normally progressing pregnancy.  Patient has an appointment 09/24/2020 with OB/GYN and will have an ultrasound at that appointment.  Overall the patient appears well.  Highly suspect vaginal bleeding/spotting is due to the patient's subchorionic hemorrhage.  We will discharge home with OB follow-up.  I discussed return precautions.  Patient agreeable plan of care.  Jade Allen was evaluated in Emergency Department on 09/19/2020 for the symptoms described in the history of present illness. She was evaluated in the context of the global COVID-19 pandemic, which necessitated consideration that the patient might be at risk for infection with the SARS-CoV-2 virus that causes COVID-19. Institutional protocols and algorithms that pertain to the evaluation of patients at risk for COVID-19 are in a state of rapid change based on information released by regulatory bodies including the CDC and federal and state organizations. These policies and algorithms were followed during the patient's care in the ED.  ____________________________________________   FINAL CLINICAL IMPRESSION(S) / ED DIAGNOSES  Vaginal bleeding   Harvest Dark, MD 09/19/20 2126

## 2020-09-19 NOTE — ED Triage Notes (Signed)
Pt here from home via POV.  Pt six weeks pregnant, recently had ED visit and was diagnosed with subchorionic hemorrhage between the gestational membranes. Pt with dark red bleeding today, states it is not heavy but enough to have her concerned. This is pt's first pregnancy.

## 2020-09-19 NOTE — ED Notes (Signed)
Patient reports dark red blood when wiping, has not noticed clots and denies soaking pads. Also denies cramping, states there is lower pelvic pressure. Denies fevers, n/v/d.

## 2020-10-07 ENCOUNTER — Other Ambulatory Visit: Payer: BC Managed Care – PPO

## 2020-11-06 ENCOUNTER — Encounter: Payer: BC Managed Care – PPO | Admitting: Certified Nurse Midwife

## 2021-05-04 ENCOUNTER — Encounter: Payer: Self-pay | Admitting: Obstetrics and Gynecology

## 2021-05-04 ENCOUNTER — Observation Stay
Admission: EM | Admit: 2021-05-04 | Discharge: 2021-05-04 | Disposition: A | Discharge status: Home / Self Care | Payer: Medicaid Other | Attending: Obstetrics and Gynecology | Admitting: Obstetrics and Gynecology

## 2021-05-04 ENCOUNTER — Other Ambulatory Visit: Payer: Self-pay

## 2021-05-04 DIAGNOSIS — Z3A38 38 weeks gestation of pregnancy: Secondary | ICD-10-CM | POA: Diagnosis not present

## 2021-05-04 DIAGNOSIS — R102 Pelvic and perineal pain: Secondary | ICD-10-CM | POA: Diagnosis not present

## 2021-05-04 DIAGNOSIS — O26893 Other specified pregnancy related conditions, third trimester: Secondary | ICD-10-CM | POA: Diagnosis present

## 2021-05-04 DIAGNOSIS — Z3490 Encounter for supervision of normal pregnancy, unspecified, unspecified trimester: Secondary | ICD-10-CM

## 2021-05-04 NOTE — Discharge Summary (Signed)
RN reviewed discharge instructions with patient. Went over red flag signs for labor and when to head to the hospital. Gave patient opportunity for questions. All questions answered at this time. Pt verbalized understanding. Pt discharged home.

## 2021-05-04 NOTE — OB Triage Note (Signed)
Pt presents to labor and delivery strong ctx last night. Pt states this morning she had increased vaginal pressure. Pt states she wanted to see if she was more dilated because she is supposed to be induced at [facility] on Wed and if she was more dilated, she was going to go in sooner than then. Pt receives her care at [facility] but lives in [place] and was closer to this location. She still wishes to deliver her baby at [facility]. Pt denies bleeding or LOF. Reports positive fetal movement. Pt VSS. Will continue to monitor.

## 2021-05-04 NOTE — H&P (Signed)
L&D OB Triage Note  Jade Allen is a 30 y.o. G1P0000 female at [redacted]w[redacted]d, EDD Estimated Date of Delivery: 05/12/21 who presented to triage for complaints of increased pelvic pressure.  She receives [facility] at [facility]. Her last appointment was last week.  She was evaluated by the nurses with no significant findings for labor. Vital signs stable. An NST was performed and has been reviewed by MD.     Physical Exam:  Vitals:   05/04/21 1044  BP: (!) 104/55  Pulse: 78  Resp: 16  Temp: 98.1 F (36.7 C)  TempSrc: Oral  SpO2: 99%  Weight: 126.1 kg  Height: 5\' 3"  (1.6 m)   Cervical Exam:  Dilation: 1.5 Effacement (%): 60 Cervical Position: Middle Station: -3 Presentation: Vertex Exam by:: Robin Searing RN  NST INTERPRETATION: Indications: rule out uterine contractions  Mode: External Baseline Rate (A): 130 bpm Variability: Moderate Accelerations: 15 x 15 Decelerations: None     Contraction Frequency (min): 5-7  Impression: reactive   Plan: NST performed was reviewed and was found to be reactive. No signs of active labor noted (cervix essentially unchanged from recent office visit). She was discharged home with bleeding/labor precautions.  Continue routine prenatal care. Encouraged to follow up with her OB/GYN or midwife as previously scheduled.     Rubie Maid, MD [facility]

## 2021-05-05 ENCOUNTER — Other Ambulatory Visit: Payer: Self-pay

## 2021-05-05 ENCOUNTER — Observation Stay
Admission: EM | Admit: 2021-05-05 | Discharge: 2021-05-05 | Disposition: A | Discharge status: Home / Self Care | Payer: Medicaid Other | Attending: Obstetrics and Gynecology | Admitting: Obstetrics and Gynecology

## 2021-05-05 ENCOUNTER — Encounter: Payer: Self-pay | Admitting: Obstetrics and Gynecology

## 2021-05-05 DIAGNOSIS — O26893 Other specified pregnancy related conditions, third trimester: Principal | ICD-10-CM | POA: Insufficient documentation

## 2021-05-05 DIAGNOSIS — R102 Pelvic and perineal pain: Secondary | ICD-10-CM | POA: Insufficient documentation

## 2021-05-05 DIAGNOSIS — Z3A39 39 weeks gestation of pregnancy: Secondary | ICD-10-CM | POA: Insufficient documentation

## 2021-05-05 DIAGNOSIS — O479 False labor, unspecified: Secondary | ICD-10-CM | POA: Diagnosis present

## 2021-05-05 NOTE — Final Progress Note (Signed)
L&D OB Triage Note  Jade Allen is a 30 y.o. G1P0000 female at [redacted]w[redacted]d, EDD Estimated Date of Delivery: 05/12/21 who presented to triage for complaints of increased pelvic pressure and loss of mucus plug. Unsure if she is having contractions. She receives [facility] at [facility]. Her last appointment was last week. She is scheduled for IOL tomorrow. She was evaluated by the nurses with no significant findings for active labor. Vital signs stable. An NST was performed and has been reviewed by MD.     Physical Exam:  Vitals:   05/05/21 1131  BP: 113/68  Pulse: 79  Resp: 18  Temp: 97.6 F (36.4 C)  TempSrc: Oral  SpO2: 99%  Weight: 126.1 kg  Height: 5\' 3"  (1.6 m)   Cervical Exam:  Dilation: 3 Effacement (%): 60 Cervical Position: Middle Station: -3 Presentation: Vertex Exam by:: Robin Searing RN  NST INTERPRETATION: Indications: rule out uterine contractions  Mode: External Baseline Rate (A): 155 bpm (fht) Variability: Moderate Accelerations: 15 x 15 Decelerations: Variable     Contraction Frequency (min): occasional  Impression: reactive   Plan: NST performed was reviewed and was found to be reactive. No signs of active labor noted (1.5 cm change in 24 hrs). She was discharged home with bleeding/labor precautions.  Continue routine prenatal care. Encouraged to follow up for her scheduled IOL tomorrow at [facility], follow up sooner if labor progresses.    Rubie Maid, MD [facility]

## 2021-05-05 NOTE — Discharge Summary (Signed)
RN reviewed discharge instructions with patient and went over signs and symptoms of labor and when to head to the hospital. Gave pt opportunity for questions. All questions answered at this time. Pt verbalized understanding. Pt discharged home.

## 2021-05-05 NOTE — OB Triage Note (Signed)
Pt presents she has increased vaginal pressure and loss of mucousy content. Pt states she is unsure if she is having ctx or not, just knows she feels pressure. Pt denies LOF or bleeding. Reports positive fetal movement. VSS. Will continue to monitor.

## 2021-05-28 IMAGING — US US PELVIS COMPLETE TRANSABD/TRANSVAG W DUPLEX
2 series · 13 of 25 positions shown · non-contrast
Comparison: No recent.

CLINICAL DATA: Pelvic pain.

EXAM:
TRANSABDOMINAL AND TRANSVAGINAL ULTRASOUND OF PELVIS
DOPPLER ULTRASOUND OF OVARIES
TECHNIQUE: Both transabdominal and transvaginal ultrasound examinations of the
pelvis were performed. Transabdominal technique was performed for
global imaging of the pelvis including uterus, ovaries, adnexal
regions, and pelvic cul-de-sac.
It was necessary to proceed with endovaginal exam following the
transabdominal exam to visualize the uterus and ovaries. Color and
duplex Doppler ultrasound was utilized to evaluate blood flow to the
ovaries.

[Series 1: us pelvis complete transabd/transvag w duplex · 7 of 148 slices shown (1 of 2)]
[im 1/148]
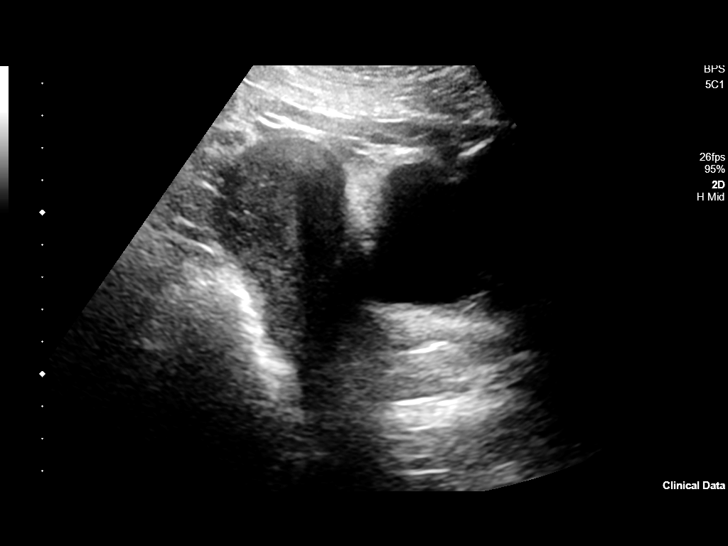
[im 25/148]
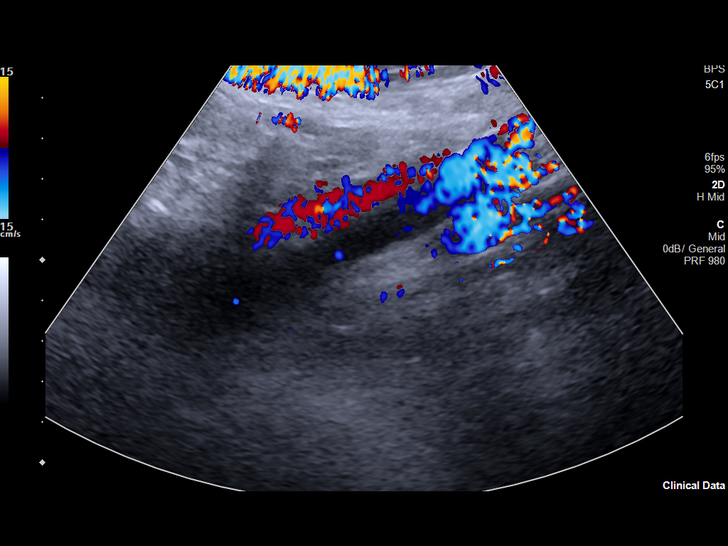
[im 50/148]
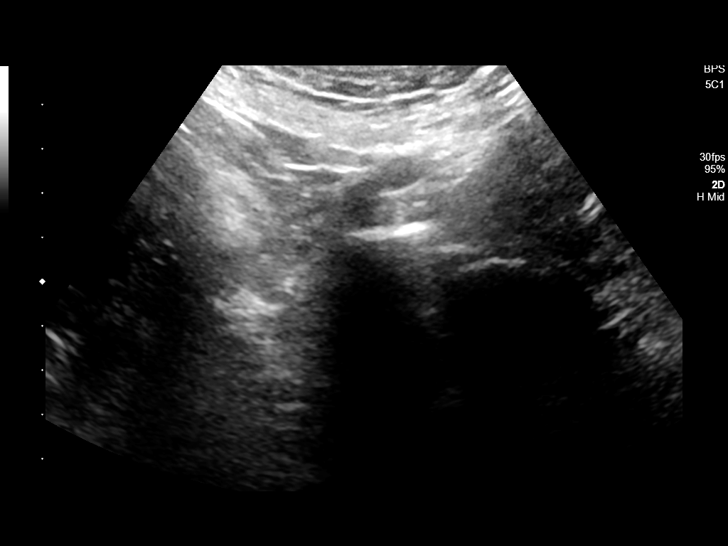
[im 74/148]
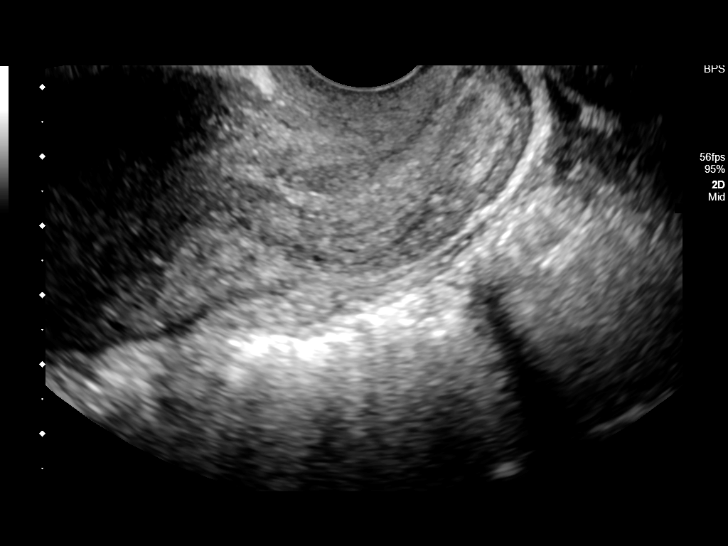
[im 99/148]
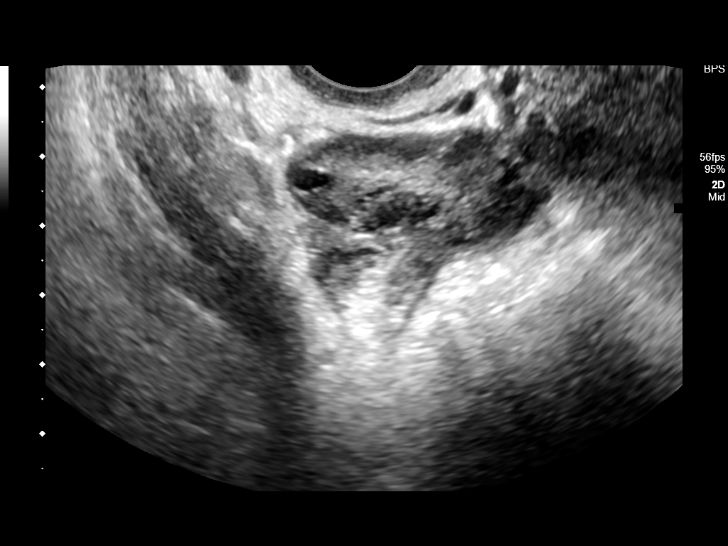
[im 123/148]
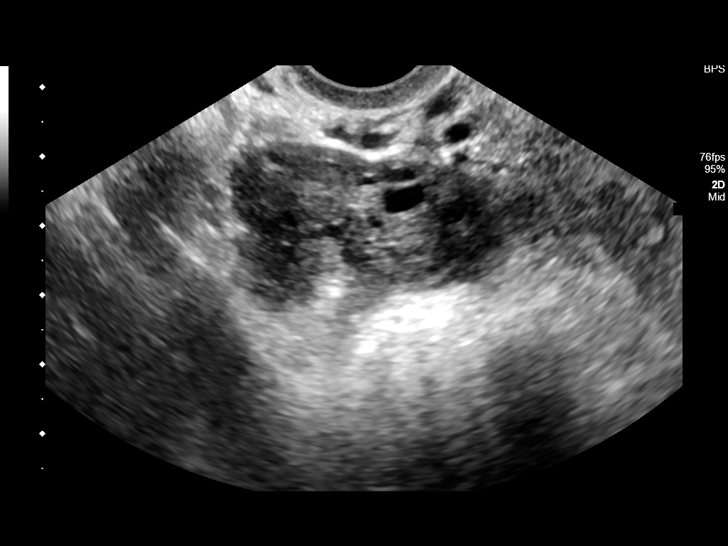
[im 148/148]
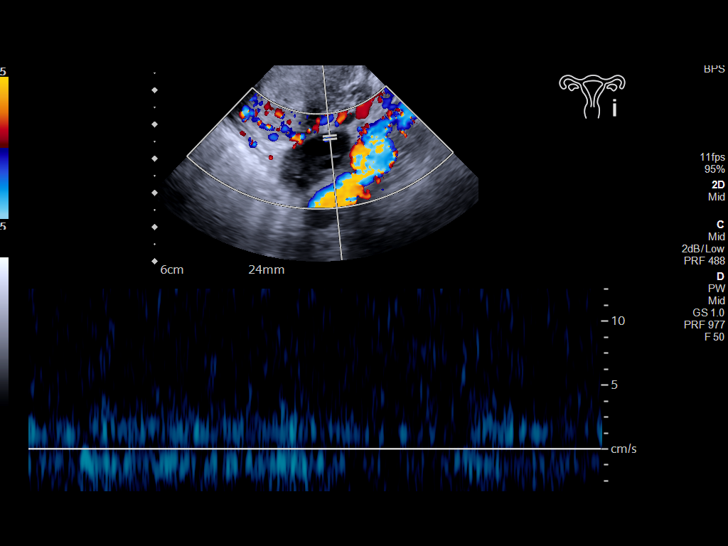

[Series 1: us pelvis complete transabd/transvag w duplex · 6 of 136 slices shown (2 of 2)]
[im 13/136]
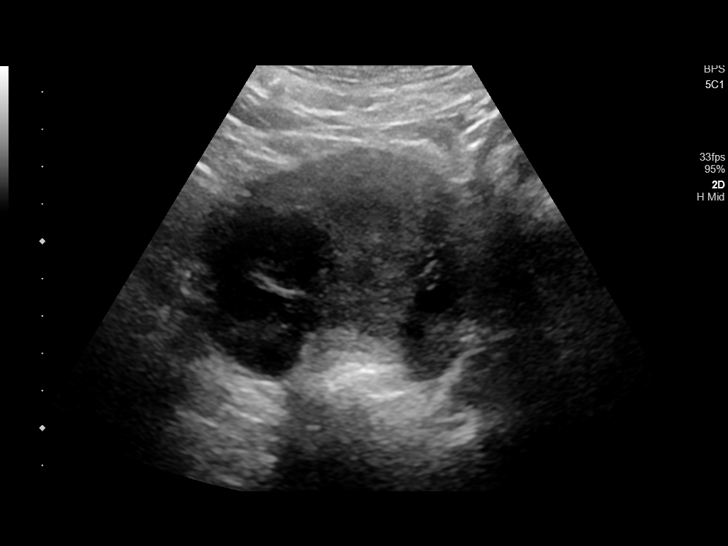
[im 37/136]
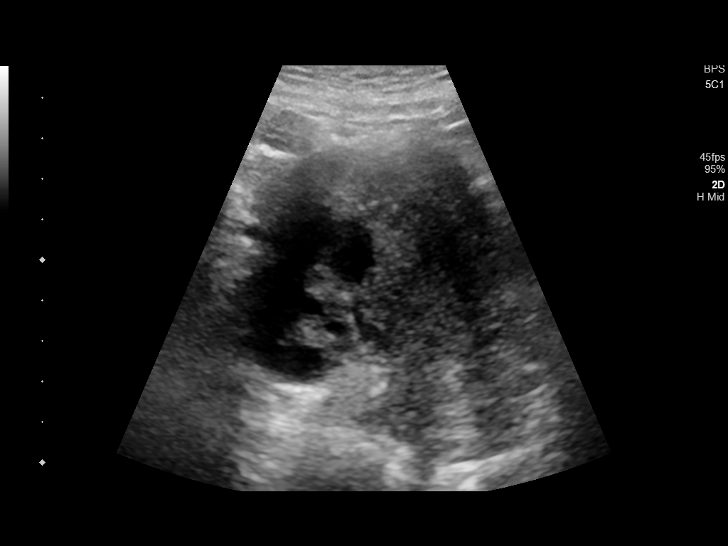
[im 62/136]
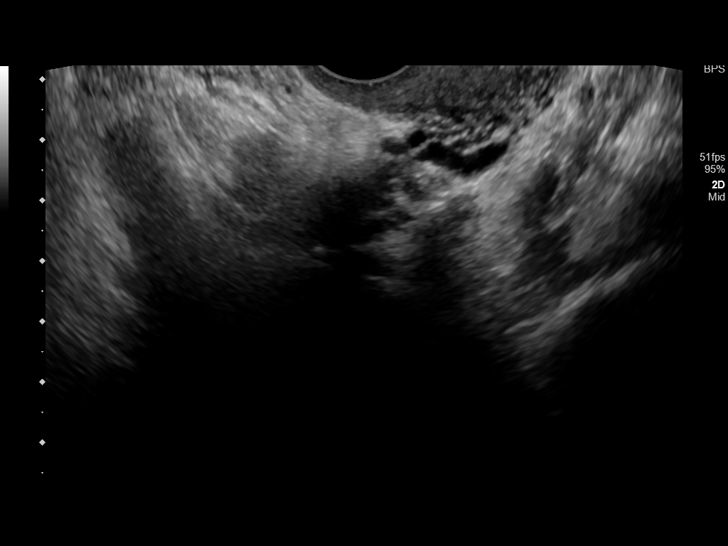
[im 86/136]
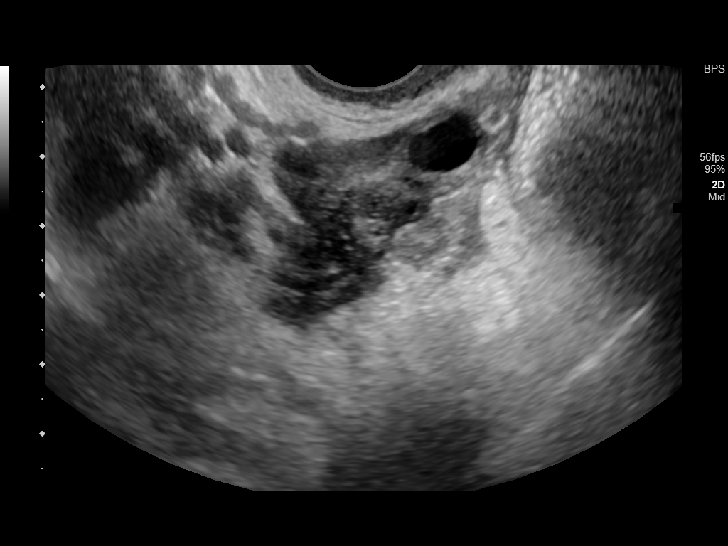
[im 111/136]
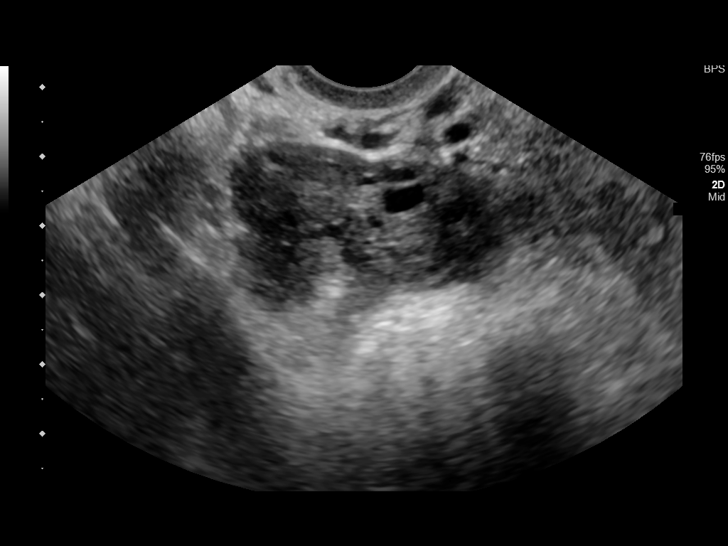
[im 136/136]
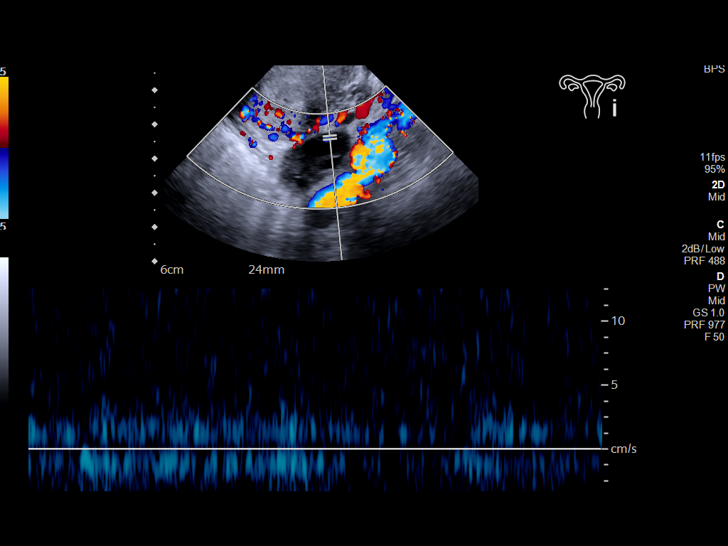

[13 of 25 positions shown; findings below may reference images not displayed]

FINDINGS: Uterus

Measurements: 8.5 x 4.3 x 6.5 cm = volume: 123.3 mL. No fibroids or
other mass visualized.

Endometrium

Thickness: 5.7 mm. 3.2 x 3.1 x 2.4 cm mass noted the posterior right
uterus consistent with fibroid.

Right ovary

Measurements: 3.1 x 1.6 x 2.9 cm = volume: 7.4 mL. Small area of
decreased echogenicity noted adjacent to the right ovary. This could
represent mild edema in this region.

Left ovary

Measurements: 3.3 x 1.3 x 2.4 cm = volume: 5.3 mL. Normal
appearance/no adnexal mass.

Pulsed Doppler evaluation of both ovaries demonstrates normal
low-resistance arterial and venous waveforms.

Other findings

None.
IMPRESSION: 1.  3.2 cm uterine fibroid.

2. Small area of decreased echogenicity noted adjacent to the right
ovary. This could represent edema next to the right ovary. Pregnancy
test suggested to exclude ectopic pregnancy. Exam otherwise
unremarkable.

## 2021-07-17 ENCOUNTER — Other Ambulatory Visit: Payer: Self-pay

## 2021-07-17 ENCOUNTER — Emergency Department
Admission: EM | Admit: 2021-07-17 | Discharge: 2021-07-17 | Disposition: A | Discharge status: Home / Self Care | Payer: Medicaid Other | Attending: Emergency Medicine | Admitting: Emergency Medicine

## 2021-07-17 DIAGNOSIS — Z7982 Long term (current) use of aspirin: Secondary | ICD-10-CM | POA: Insufficient documentation

## 2021-07-17 DIAGNOSIS — M654 Radial styloid tenosynovitis [de Quervain]: Secondary | ICD-10-CM | POA: Insufficient documentation

## 2021-07-17 DIAGNOSIS — Z87891 Personal history of nicotine dependence: Secondary | ICD-10-CM | POA: Insufficient documentation

## 2021-07-17 DIAGNOSIS — M25531 Pain in right wrist: Secondary | ICD-10-CM | POA: Diagnosis present

## 2021-07-17 MED ORDER — MELOXICAM 15 MG PO TABS: 15.0000 mg | ORAL_TABLET | Freq: Every day | ORAL | 2 refills | Status: AC

## 2021-07-17 NOTE — ED Triage Notes (Signed)
Pt come with c/o right wrist pain for about week or so. Pt states pain to stretch it out. Pt denies any recent injuries.

## 2021-07-17 NOTE — Discharge Instructions (Signed)
Take Meloxicam once daily for pain and inflammation.  

## 2021-07-17 NOTE — ED Provider Notes (Signed)
[facility]-EMERGENCY DEPARTMENT  ____________________________________________  Time seen: Approximately 6:51 PM  I have reviewed the triage vital signs and the nursing notes.   HISTORY  Chief Complaint Wrist Pain   Historian Patient     HPI Evia Bardos is a 30 y.o. female presents to the emergency department with pain along the first dorsal extensor compartment along the right forearm.  Patient has been repetitively lifting her infant.  No falls or mechanisms of trauma.  No numbness or tingling in the right hand.  No similar symptoms in the past.  No other alleviating measures have been attempted.   Past Medical History:  Diagnosis Date   ADHD    Gonorrhea      Immunizations up to date:  Yes.     Past Medical History:  Diagnosis Date   ADHD    Gonorrhea     Patient Active Problem List   Diagnosis Date Noted   Threatened labor at term 05/05/2021   Pregnancy 05/04/2021   Pelvic pressure in pregnancy, third trimester 05/04/2021   Fibroid 07/31/2019    Past Surgical History:  Procedure Laterality Date   NO PAST SURGERIES      Prior to Admission medications   Medication Sig Start Date End Date Taking? Authorizing Provider  meloxicam (MOBIC) 15 MG tablet Take 1 tablet (15 mg total) by mouth daily for 7 days. 07/17/21 07/24/21 Yes Vallarie Mare M, PA-C  aspirin 81 MG chewable tablet Chew by mouth daily.    [provider]  metroNIDAZOLE (FLAGYL) 500 MG tablet Take two tablets by mouth twice a day, for one day.  Or you can take all four tablets at once if you can tolerate it. Patient not taking: Reported on 05/04/2021 09/11/20   Diona Fanti, CNM  Prenatal MV & Min w/FA-DHA (PRENATAL ADULT GUMMY/DHA/FA PO) Take by mouth.    [provider]    Allergies Patient has no known allergies.  Family History  Problem Relation Age of Onset   Diabetes Mother    Lung cancer Father 50   Cancer Maternal Grandfather 56       Type unknown     Social History Social History   Tobacco Use   Smoking status: Former    Packs/day: 1.00    Types: Cigarettes    Quit date: 09/03/2020    Years since quitting: 0.8   Smokeless tobacco: Never  Vaping Use   Vaping Use: Former   Substances: Nicotine, Flavoring  Substance Use Topics   Alcohol use: Not Currently    Comment: occasional   Drug use: Not Currently    Types: Marijuana    Comment: "trying to quit"     Review of Systems  Constitutional: No fever/chills Eyes:  No discharge ENT: No upper respiratory complaints. Respiratory: no cough. No SOB/ use of accessory muscles to breath Gastrointestinal:   No nausea, no vomiting.  No diarrhea.  No constipation. Musculoskeletal: Patient has right forearm pain.  Skin: Negative for rash, abrasions, lacerations, ecchymosis.    ____________________________________________   PHYSICAL EXAM:  VITAL SIGNS: ED Triage Vitals  Enc Vitals Group     BP 07/17/21 1532 123/86     Pulse Rate 07/17/21 1532 73     Resp 07/17/21 1532 18     Temp 07/17/21 1532 98.9 F (37.2 C)     Temp src --      SpO2 07/17/21 1532 96 %     Weight --      Height --  Head Circumference --      Peak Flow --      Pain Score 07/17/21 1531 10     Pain Loc --      Pain Edu? --      Excl. in [place]? --      Constitutional: Alert and oriented. Well appearing and in no acute distress. Eyes: Conjunctivae are normal. PERRL. EOMI. Head: Atraumatic. ENT:      Nose: No congestion/rhinnorhea.      Mouth/Throat: Mucous membranes are moist.  Neck: No stridor.  No cervical spine tenderness to palpation. Cardiovascular: Normal rate, regular rhythm. Normal S1 and S2.  Good peripheral circulation. Respiratory: Normal respiratory effort without tachypnea or retractions. Lungs CTAB. Good air entry to the bases with no decreased or absent breath sounds Gastrointestinal: Bowel sounds x 4 quadrants. Soft and nontender to palpation. No guarding or rigidity. No  distention. Musculoskeletal: Patient has symmetric strength in the upper extremities.  Positive Finkelstein test on the right.  Full range of motion to all extremities. No obvious deformities noted Neurologic:  Normal for age. No gross focal neurologic deficits are appreciated.  Skin:  Skin is warm, dry and intact. No rash noted. Psychiatric: Mood and affect are normal for age. Speech and behavior are normal.   ____________________________________________   LABS (all labs ordered are listed, but only abnormal results are displayed)  Labs Reviewed - No data to display ____________________________________________  EKG   ____________________________________________  RADIOLOGY   No results found.  ____________________________________________    PROCEDURES  Procedure(s) performed:     Procedures     Medications - No data to display   ____________________________________________   INITIAL IMPRESSION / ASSESSMENT AND PLAN / ED COURSE  Pertinent labs & imaging results that were available during my care of the patient were reviewed by me and considered in my medical decision making (see chart for details).    Assessment and plan Harriet Pho tenosynovitis 30 year old female presents to the emergency department with right forearm pain after repetitive lifting.  History of physical exam findings suggest de Quervain's tenosynovitis.  Patient was placed in a Velcro splint to wear at night and was started on daily meloxicam and advised to follow-up with orthopedics if her symptoms persist after conservative measures were attempted.     ____________________________________________  FINAL CLINICAL IMPRESSION(S) / ED DIAGNOSES  Final diagnoses:  Tenosynovitis, de Quervain      NEW MEDICATIONS STARTED DURING THIS VISIT:  ED Discharge Orders          Ordered    meloxicam (MOBIC) 15 MG tablet  Daily        07/17/21 1754                This chart was  dictated using voice recognition software/Dragon. Despite best efforts to proofread, errors can occur which can change the meaning. Any change was purely unintentional.     Lannie Fields, PA-C 07/17/21 1901    Lucrezia Starch, MD 07/17/21 [phone]

## 2021-10-31 ENCOUNTER — Encounter: Payer: Self-pay | Admitting: Emergency Medicine

## 2021-10-31 ENCOUNTER — Emergency Department
Admission: EM | Admit: 2021-10-31 | Discharge: 2021-10-31 | Disposition: A | Discharge status: Home / Self Care | Payer: Medicaid Other | Attending: Emergency Medicine | Admitting: Emergency Medicine

## 2021-10-31 ENCOUNTER — Other Ambulatory Visit: Payer: Self-pay

## 2021-10-31 DIAGNOSIS — Z7982 Long term (current) use of aspirin: Secondary | ICD-10-CM | POA: Diagnosis not present

## 2021-10-31 DIAGNOSIS — Z87891 Personal history of nicotine dependence: Secondary | ICD-10-CM | POA: Insufficient documentation

## 2021-10-31 DIAGNOSIS — U071 COVID-19: Secondary | ICD-10-CM | POA: Diagnosis not present

## 2021-10-31 DIAGNOSIS — R059 Cough, unspecified: Secondary | ICD-10-CM | POA: Diagnosis present

## 2021-10-31 LAB — RESP PANEL BY RT-PCR (FLU A&B, COVID) ARPGX2
Influenza A by PCR: NEGATIVE
Influenza B by PCR: NEGATIVE
SARS Coronavirus 2 by RT PCR: POSITIVE — AB

## 2021-10-31 NOTE — ED Triage Notes (Signed)
Pt via POV from home. Pt c/o cough and nasal congestion. Pt is A&Ox4 and NAD.

## 2021-10-31 NOTE — ED Provider Notes (Signed)
[facility]-EMERGENCY DEPARTMENT  ____________________________________________  Time seen: Approximately 5:33 PM  I have reviewed the triage vital signs and the nursing notes.   HISTORY  Chief Complaint No chief complaint on file.   Historian Patient    HPI Jade Allen is a 30 y.o. female presents to the emergency department with cough and nasal congestion and a sick contact at home and tested positive for COVID-19.  Patient describes her symptoms as mild at this time without chest pain, chest tightness or abdominal pain.   Past Medical History:  Diagnosis Date   ADHD    Gonorrhea      Immunizations up to date:  Yes.     Past Medical History:  Diagnosis Date   ADHD    Gonorrhea     Patient Active Problem List   Diagnosis Date Noted   Threatened labor at term 05/05/2021   Pregnancy 05/04/2021   Pelvic pressure in pregnancy, third trimester 05/04/2021   Fibroid 07/31/2019    Past Surgical History:  Procedure Laterality Date   NO PAST SURGERIES      Prior to Admission medications   Medication Sig Start Date End Date Taking? Authorizing Provider  aspirin 81 MG chewable tablet Chew by mouth daily.    [provider]  metroNIDAZOLE (FLAGYL) 500 MG tablet Take two tablets by mouth twice a day, for one day.  Or you can take all four tablets at once if you can tolerate it. Patient not taking: Reported on 05/04/2021 09/11/20   Diona Fanti, CNM  Prenatal MV & Min w/FA-DHA (PRENATAL ADULT GUMMY/DHA/FA PO) Take by mouth.    [provider]    Allergies Patient has no known allergies.  Family History  Problem Relation Age of Onset   Diabetes Mother    Lung cancer Father 53   Cancer Maternal Grandfather 84       Type unknown    Social History Social History   Tobacco Use   Smoking status: Former    Packs/day: 1.00    Types: Cigarettes    Quit date: 09/03/2020    Years since quitting: 1.1   Smokeless tobacco: Never  Vaping Use    Vaping Use: Former   Substances: Nicotine, Flavoring  Substance Use Topics   Alcohol use: Not Currently    Comment: occasional   Drug use: Not Currently    Types: Marijuana    Comment: "trying to quit"      Review of Systems  Constitutional: Patient has fever.  Eyes: No visual changes. No discharge ENT: Patient has congestion.  Cardiovascular: no chest pain. Respiratory: Patient has cough.  Gastrointestinal: No abdominal pain.  No nausea, no vomiting. Patient had diarrhea.  Genitourinary: Negative for dysuria. No hematuria Musculoskeletal: Patient has myalgias.  Skin: Negative for rash, abrasions, lacerations, ecchymosis. Neurological: Patient has headache, no focal weakness or numbness.    ____________________________________________   PHYSICAL EXAM:  VITAL SIGNS: ED Triage Vitals  Enc Vitals Group     BP 10/31/21 1546 119/76     Pulse Rate 10/31/21 1546 90     Resp 10/31/21 1546 20     Temp 10/31/21 1546 98.9 F (37.2 C)     Temp Source 10/31/21 1546 Oral     SpO2 10/31/21 1546 97 %     Weight 10/31/21 1544 270 lb (122.5 kg)     Height 10/31/21 1544 5\' 3"  (1.6 m)     Head Circumference --      Peak Flow --  Pain Score 10/31/21 1544 0     Pain Loc --      Pain Edu? --      Excl. in [place]? --      Constitutional: Alert and oriented. Patient is lying supine. Eyes: Conjunctivae are normal. PERRL. EOMI. Head: Atraumatic. ENT:      Ears: Tympanic membranes are mildly injected with mild effusion bilaterally.       Nose: No congestion/rhinnorhea.      Mouth/Throat: Mucous membranes are moist. Posterior pharynx is mildly erythematous.  Hematological/Lymphatic/Immunilogical: No cervical lymphadenopathy.  Cardiovascular: Normal rate, regular rhythm. Normal S1 and S2.  Good peripheral circulation. Respiratory: Normal respiratory effort without tachypnea or retractions. Lungs CTAB. Good air entry to the bases with no decreased or absent breath  sounds. Gastrointestinal: Bowel sounds 4 quadrants. Soft and nontender to palpation. No guarding or rigidity. No palpable masses. No distention. No CVA tenderness. Musculoskeletal: Full range of motion to all extremities. No gross deformities appreciated. Neurologic:  Normal speech and language. No gross focal neurologic deficits are appreciated.  Skin:  Skin is warm, dry and intact. No rash noted. Psychiatric: Mood and affect are normal. Speech and behavior are normal. Patient exhibits appropriate insight and judgement.   ____________________________________________   LABS (all labs ordered are listed, but only abnormal results are displayed)  Labs Reviewed  RESP PANEL BY RT-PCR (FLU A&B, COVID) ARPGX2 - Abnormal; Notable for the following components:      Result Value   SARS Coronavirus 2 by RT PCR POSITIVE (*)    All other components within normal limits   ____________________________________________  EKG   ____________________________________________  RADIOLOGY   No results found.  ____________________________________________    PROCEDURES  Procedure(s) performed:     Procedures     Medications - No data to display   ____________________________________________   INITIAL IMPRESSION / ASSESSMENT AND PLAN / ED COURSE  Pertinent labs & imaging results that were available during my care of the patient were reviewed by me and considered in my medical decision making (see chart for details).      Assessment and plan COVID-18 30 year old female presents to the emergency department with viral URI-like symptoms.  Vital signs are reassuring at triage.  Patient was alert, active and nontoxic-appearing.  She tested positive for COVID-19.  Rest and hydration were encouraged at home.  Tylenol and ibuprofen alternating were recommended for fever and body aches.     ____________________________________________  FINAL CLINICAL IMPRESSION(S) / ED  DIAGNOSES  Final diagnoses:  COVID-19      NEW MEDICATIONS STARTED DURING THIS VISIT:  ED Discharge Orders     None           This chart was dictated using voice recognition software/Dragon. Despite best efforts to proofread, errors can occur which can change the meaning. Any change was purely unintentional.     Lannie Fields, PA-C 10/31/21 1734    Vanessa Massena, MD 10/31/21 [phone]

## 2021-10-31 NOTE — Discharge Instructions (Signed)
Take Tylenol and ibuprofen alternating for fever and body aches.

## 2022-01-24 ENCOUNTER — Other Ambulatory Visit: Payer: Self-pay

## 2022-01-24 ENCOUNTER — Emergency Department
Admission: EM | Admit: 2022-01-24 | Discharge: 2022-01-24 | Disposition: A | Discharge status: Home / Self Care | Payer: Medicaid Other | Attending: Emergency Medicine | Admitting: Emergency Medicine

## 2022-01-24 DIAGNOSIS — K029 Dental caries, unspecified: Secondary | ICD-10-CM | POA: Insufficient documentation

## 2022-01-24 DIAGNOSIS — K047 Periapical abscess without sinus: Secondary | ICD-10-CM | POA: Diagnosis not present

## 2022-01-24 DIAGNOSIS — K0889 Other specified disorders of teeth and supporting structures: Secondary | ICD-10-CM | POA: Diagnosis present

## 2022-01-24 MED ORDER — IBUPROFEN 400 MG PO TABS: 400.0000 mg | ORAL_TABLET | Freq: Four times a day (QID) | ORAL | 0 refills | Status: AC | PRN

## 2022-01-24 MED ORDER — IBUPROFEN 400 MG PO TABS
400.0000 mg | ORAL_TABLET | Freq: Once | ORAL | Status: AC
  Administered 2022-01-24: 400 mg via ORAL
  Filled 2022-01-24: qty 1

## 2022-01-24 MED ORDER — AMOXICILLIN-POT CLAVULANATE 875-125 MG PO TABS: 1.0000 | ORAL_TABLET | Freq: Two times a day (BID) | ORAL | 0 refills | Status: AC

## 2022-01-24 MED ORDER — AMOXICILLIN-POT CLAVULANATE 875-125 MG PO TABS
1.0000 | ORAL_TABLET | Freq: Once | ORAL | Status: AC
  Administered 2022-01-24: 1 via ORAL
  Filled 2022-01-24: qty 1

## 2022-01-24 NOTE — ED Triage Notes (Signed)
Pt comes pov with abscess to left side of face since Friday. Started as dental pain.  ?

## 2022-01-24 NOTE — ED Notes (Signed)
Pt to ED for facial swelling on L side since 2 days ago; pt had about 1 month or less ago she had R upper tooth that broke off, no problems since then until she noticed swelling to L face. ? ?States that top L gumline is sore, especially with chewing or cold drinks. Denies dental pain on L side. Denies pain in broken tooth to R side. ? ?EDP at bedside examining pt. ?

## 2022-01-24 NOTE — ED Provider Notes (Signed)
? ?[facility] ?Provider Note ? ? ? Event Date/Time  ? First MD Initiated Contact with Patient 01/24/22 1219   ?  (approximate) ? ? ?History  ? ?Abscess ? ? ?HPI ? ?Jade Allen is a 31 y.o. female past medical history of ADHD presents with left-sided facial swelling and pain.  Patient notes that she had a broken tooth on the right upper side about a month ago which is causing her pain but then over the last day or 2 has noticed pain in the left cheek area.  Does not know of a specific tooth that is hurting her but does have pain in the back of her mouth.  No fevers chills still tolerating p.o. no difficulty swallowing.  Does not currently have a dentist but is looking. ?  ? ?Past Medical History:  ?Diagnosis Date  ? ADHD   ? Gonorrhea   ? ? ?Patient Active Problem List  ? Diagnosis Date Noted  ? Threatened labor at term 05/05/2021  ? Pregnancy 05/04/2021  ? Pelvic pressure in pregnancy, third trimester 05/04/2021  ? Fibroid 07/31/2019  ? ? ? ?Physical Exam  ?Triage Vital Signs: ?ED Triage Vitals [01/24/22 1201]  ?Enc Vitals Group  ?   BP 125/64  ?   Pulse Rate 69  ?   Resp 18  ?   Temp 98.7 ?F (37.1 ?C)  ?   Temp Source Oral  ?   SpO2 98 %  ?   Weight 262 lb (118.8 kg)  ?   Height '5\' 3"'$  (1.6 m)  ?   Head Circumference   ?   Peak Flow   ?   Pain Score 10  ?   Pain Loc   ?   Pain Edu?   ?   Excl. in [place]?   ? ? ?Most recent vital signs: ?Vitals:  ? 01/24/22 1201  ?BP: 125/64  ?Pulse: 69  ?Resp: 18  ?Temp: 98.7 ?F (37.1 ?C)  ?SpO2: 98%  ? ? ? ?General: Awake, no distress.  ?CV:  Good peripheral perfusion.  ?Resp:  Normal effort.  ?Abd:  No distention.  ?Neuro:             Awake, Alert, Oriented x 3  ?Other:  Mild swelling of the left cheek and left upper lip ?  No trismus, no submandibular or sublingual swelling no elevation of floor the mouth ?Tenderness of the left upper most posterior mucobuccal fold with mild swelling  ?Multiple dental caries  ?No tenderness to percussion of specific  tooth ? ? ?ED Results / Procedures / Treatments  ?Labs ?(all labs ordered are listed, but only abnormal results are displayed) ?Labs Reviewed - No data to display ? ? ?EKG ? ? ? ? ?RADIOLOGY ? ? ?PROCEDURES: ? ?Critical Care performed: No ? ? ? ?MEDICATIONS ORDERED IN ED: ?Medications  ?ibuprofen (ADVIL) tablet 400 mg (has no administration in time range)  ?amoxicillin-clavulanate (AUGMENTIN) 875-125 MG per tablet 1 tablet (has no administration in time range)  ? ? ? ?IMPRESSION / MDM / ASSESSMENT AND PLAN / ED COURSE  ?I reviewed the triage vital signs and the nursing notes. ?             ?               ?The patient is a 31 year old female who presents with facial swelling and dental pain.  She has multiple dental caries and a broken right upper tooth which is been going  on for about a month but then has noticed some swelling of the left cheek and pain along the left posterior gumline for about 2 days.  No fevers or difficulty tolerating p.o.  On exam she does have some mild swelling of the left cheek extending to the left upper lip but no trismus no swelling of the submandibular or sublingual areas to suggest deep space infection or Ludwig's angina.  She has edema and tenderness along the left posterior mucobuccal fold jesting dental abscess although the tooth is not really tender to percussion.  Ultimately suspect odontogenic origin of her facial swelling.  Will start on Augmentin for 7 days.  Advised NSAIDs and she was given a list of dentists in the area. ? ? ?FINAL CLINICAL IMPRESSION(S) / ED DIAGNOSES  ? ?Final diagnoses:  ?Dental abscess  ? ? ? ?Rx / DC Orders  ? ?ED Discharge Orders   ? ?      Ordered  ?  amoxicillin-clavulanate (AUGMENTIN) 875-125 MG tablet  2 times daily       ? 01/24/22 1229  ?  ibuprofen (ADVIL) 400 MG tablet  Every 6 hours PRN       ? 01/24/22 1229  ? ?  ?  ? ?  ? ? ? ?Note:  This document was prepared using Dragon voice recognition software and may include unintentional dictation  errors. ?  ?Rada Hay, MD ?01/24/22 1235 ? ?

## 2022-03-08 ENCOUNTER — Other Ambulatory Visit: Payer: Self-pay

## 2022-03-08 ENCOUNTER — Emergency Department
Admission: EM | Admit: 2022-03-08 | Discharge: 2022-03-08 | Disposition: A | Discharge status: Home / Self Care | Payer: Medicaid Other | Attending: Emergency Medicine | Admitting: Emergency Medicine

## 2022-03-08 DIAGNOSIS — R109 Unspecified abdominal pain: Secondary | ICD-10-CM | POA: Diagnosis not present

## 2022-03-08 DIAGNOSIS — Z3A01 Less than 8 weeks gestation of pregnancy: Secondary | ICD-10-CM | POA: Diagnosis not present

## 2022-03-08 DIAGNOSIS — N9489 Other specified conditions associated with female genital organs and menstrual cycle: Secondary | ICD-10-CM | POA: Insufficient documentation

## 2022-03-08 DIAGNOSIS — O26891 Other specified pregnancy related conditions, first trimester: Secondary | ICD-10-CM | POA: Diagnosis not present

## 2022-03-08 LAB — HCG, QUANTITATIVE, PREGNANCY: hCG, Beta Chain, Quant, S: 175 m[IU]/mL — ABNORMAL HIGH (ref <5)

## 2022-03-08 NOTE — ED Triage Notes (Signed)
Pt c/o "period cramps", states her period was late and she took 2 pregnancy test at home that were positive and states she needs to know how far along she is ?

## 2022-03-08 NOTE — ED Provider Notes (Signed)
? ?  [facility] ?Provider Note ? ? ? Event Date/Time  ? First MD Initiated Contact with Patient 03/08/22 1237   ?  (approximate) ? ?History  ? ?Chief Complaint: Abdominal Pain ? ?HPI ? ?Jade Allen is a 31 y.o. female with no significant past medical history presents to the emergency department with a positive pregnancy test at home.  According to the patient her last menstrual period was 02/07/2022.  Patient states she is approximately a week late on her menstrual cycle so she took a pregnancy test yesterday, took another one this morning they were both positive so the patient came to the emergency department for evaluation.  Patient denies any abdominal pain does state occasional cramping but denies any discomfort or pain currently.  Denies any vaginal bleeding discharge or fluid leakage.  No urinary symptoms. ? ?Physical Exam  ? ?Triage Vital Signs: ?ED Triage Vitals  ?Enc Vitals Group  ?   BP 03/08/22 1207 (!) 138/95  ?   Pulse Rate 03/08/22 1207 71  ?   Resp 03/08/22 1207 16  ?   Temp 03/08/22 1207 98.5 ?F (36.9 ?C)  ?   Temp Source 03/08/22 1207 Oral  ?   SpO2 03/08/22 1207 97 %  ?   Weight 03/08/22 1208 262 lb (118.8 kg)  ?   Height 03/08/22 1208 '5\' 3"'$  (1.6 m)  ?   Head Circumference --   ?   Peak Flow --   ?   Pain Score 03/08/22 1216 5  ?   Pain Loc --   ?   Pain Edu? --   ?   Excl. in [place]? --   ? ? ?Most recent vital signs: ?Vitals:  ? 03/08/22 1207  ?BP: (!) 138/95  ?Pulse: 71  ?Resp: 16  ?Temp: 98.5 ?F (36.9 ?C)  ?SpO2: 97%  ? ? ?General: Awake, no distress.  ?CV:  Good peripheral perfusion.  Regular rate and rhythm  ?Resp:  Normal effort.  Equal breath sounds bilaterally.  ?Abd:  No distention.  Soft, nontender.  No rebound or guarding.  Benign abdomen ? ? ?ED Results / Procedures / Treatments  ? ?MEDICATIONS ORDERED IN ED: ?Medications - No data to display ? ? ?IMPRESSION / MDM / ASSESSMENT AND PLAN / ED COURSE  ?I reviewed the triage vital signs and the nursing notes. ? ?Patient  presents to the emergency department to positive pregnancy test at home.  Overall the patient appears well, benign abdomen, reassuring physical exam.  Patient's beta-hCG is 175 consistent with likely early pregnancy.  Do not believe ultrasound would be beneficial at this point as the patient has no lower abdominal pain and a beta-hCG of 175.  I discussed with the patient typical prenatal care and following up with an OB in the next 4 to 6 weeks.  Patient agreeable to plan of care.  Discussed return precautions. ? ?FINAL CLINICAL IMPRESSION(S) / ED DIAGNOSES  ? ?First trimester pregnancy ? ? ?Note:  This document was prepared using Dragon voice recognition software and may include unintentional dictation errors. ?  ?Harvest Dark, MD ?03/08/22 1341 ? ?

## 2022-03-22 ENCOUNTER — Encounter: Payer: Self-pay | Admitting: Obstetrics

## 2022-03-22 ENCOUNTER — Ambulatory Visit (INDEPENDENT_AMBULATORY_CARE_PROVIDER_SITE_OTHER): Payer: Medicaid Other | Admitting: Obstetrics

## 2022-03-22 VITALS — BP 137/82 | HR 63 | Ht 63.0 in | Wt 268.4 lb

## 2022-03-22 DIAGNOSIS — Z3200 Encounter for pregnancy test, result unknown: Secondary | ICD-10-CM

## 2022-03-22 LAB — POCT URINE PREGNANCY: Preg Test, Ur: POSITIVE — AB

## 2022-03-22 MED ORDER — ASPIRIN EC 81 MG PO TBEC: 162.0000 mg | DELAYED_RELEASE_TABLET | Freq: Every day | ORAL | 11 refills | Status: AC

## 2022-03-22 MED ORDER — ONDANSETRON HCL 4 MG PO TABS: 4.0000 mg | ORAL_TABLET | Freq: Three times a day (TID) | ORAL | 0 refills | Status: AC | PRN

## 2022-03-22 NOTE — Progress Notes (Signed)
Pregnancy Confirmation ? ?SUBJECTIVE ? ?Jade Allen is a 31 y.o. female who presents for evaluation of amenorrhea and possible pregnancy. Patient's last menstrual period was 02/07/2022 (exact date). ?Normal. She reports regular periods every month. ?Pregnancy is desired but came earlier than expected (has 67-monthold) ?Current symptoms includefatigue, nausea, and positive home pregnancy test ? ?Review of Systems ?Pertinent items are noted in HPI. ? ?OBJECTIVE ?BP 137/82   Pulse 63   Ht '5\' 3"'$  (1.6 m)   Wt 268 lb 6.4 oz (121.7 kg)   LMP 02/07/2022 (Exact Date)   BMI 47.54 kg/m?  ?Body mass index is 47.54 kg/m?. ? ?Alert, well appearing, and in no distress and oriented to person, place, and time ? ?Lab Review ?Urine hCG: positive ? ?ASSESSMENT ?Amenorrhea. Presumed pregnancy at [id number] EDD of Estimated Date of Delivery: 11/14/22. ? ?PLAN  ?Encouraged a well-balanced diet, rest, hydration, prenatal vitamins, and walking for exercise. Counseled to avoid alcohol, tobacco, and recreational drugs and to minimize caffeine intake. Safe medications list given. Recommend ASA 162 starting at 12 weeks. Discussed non-pharmacologic relief measures for nausea. Rx for Zofran sent. Reviewed midwifery care for pregnancy and birth. Genetic screening options were discussed.  A dating [place] offered and ordered. Return in 2-5 weeks for a nurse visit for labs and prenatal education, and 6-7 weeks for a NOB physical and genetic screening if desired.  ? ?MeLloyd HugerCNM  ?

## 2022-03-24 ENCOUNTER — Other Ambulatory Visit: Payer: Self-pay

## 2022-03-24 ENCOUNTER — Emergency Department: Payer: Medicaid Other

## 2022-03-24 ENCOUNTER — Emergency Department
Admission: EM | Admit: 2022-03-24 | Discharge: 2022-03-24 | Disposition: A | Discharge status: Home / Self Care | Payer: Medicaid Other | Attending: Emergency Medicine | Admitting: Emergency Medicine

## 2022-03-24 DIAGNOSIS — O209 Hemorrhage in early pregnancy, unspecified: Secondary | ICD-10-CM | POA: Diagnosis present

## 2022-03-24 DIAGNOSIS — O200 Threatened abortion: Secondary | ICD-10-CM | POA: Insufficient documentation

## 2022-03-24 DIAGNOSIS — Z3A01 Less than 8 weeks gestation of pregnancy: Secondary | ICD-10-CM | POA: Insufficient documentation

## 2022-03-24 LAB — CBC WITH DIFFERENTIAL/PLATELET
Abs Immature Granulocytes: 0.02 10*3/uL (ref 0.00–0.07)
Basophils Absolute: 0 10*3/uL (ref 0.0–0.1)
Basophils Relative: 0 %
Eosinophils Absolute: 0.5 10*3/uL (ref 0.0–0.5)
Eosinophils Relative: 7 %
HCT: 38.2 % (ref 36.0–46.0)
Hemoglobin: 12 g/dL (ref 12.0–15.0)
Immature Granulocytes: 0 %
Lymphocytes Relative: 22 %
Lymphs Abs: 1.6 10*3/uL (ref 0.7–4.0)
MCH: 23.6 pg — ABNORMAL LOW (ref 26.0–34.0)
MCHC: 31.4 g/dL (ref 30.0–36.0)
MCV: 75.2 fL — ABNORMAL LOW (ref 80.0–100.0)
Monocytes Absolute: 0.6 10*3/uL (ref 0.1–1.0)
Monocytes Relative: 8 %
Neutro Abs: 4.5 10*3/uL (ref 1.7–7.7)
Neutrophils Relative %: 63 %
Platelets: 344 10*3/uL (ref 150–400)
RBC: 5.08 MIL/uL (ref 3.87–5.11)
RDW: 14.7 % (ref 11.5–15.5)
WBC: 7.2 10*3/uL (ref 4.0–10.5)
nRBC: 0 % (ref 0.0–0.2)

## 2022-03-24 LAB — COMPREHENSIVE METABOLIC PANEL
ALT: 19 U/L (ref 0–44)
AST: 15 U/L (ref 15–41)
Albumin: 4 g/dL (ref 3.5–5.0)
Alkaline Phosphatase: 41 U/L (ref 38–126)
Anion gap: 5 (ref 5–15)
BUN: 9 mg/dL (ref 6–20)
CO2: 23 mmol/L (ref 22–32)
Calcium: 8.8 mg/dL — ABNORMAL LOW (ref 8.9–10.3)
Chloride: 108 mmol/L (ref 98–111)
Creatinine, Ser: 0.53 mg/dL (ref 0.44–1.00)
GFR, Estimated: >60 mL/min (ref >60)
Glucose, Bld: 100 mg/dL — ABNORMAL HIGH (ref 70–99)
Potassium: 3.8 mmol/L (ref 3.5–5.1)
Sodium: 136 mmol/L (ref 135–145)
Total Bilirubin: 0.5 mg/dL (ref 0.3–1.2)
Total Protein: 6.8 g/dL (ref 6.5–8.1)

## 2022-03-24 LAB — URINALYSIS, ROUTINE W REFLEX MICROSCOPIC
Bilirubin Urine: NEGATIVE
Glucose, UA: NEGATIVE mg/dL
Hgb urine dipstick: NEGATIVE
Ketones, ur: NEGATIVE mg/dL
Leukocytes,Ua: NEGATIVE
Nitrite: NEGATIVE
Protein, ur: NEGATIVE mg/dL
Specific Gravity, Urine: 1.018 (ref 1.005–1.030)
pH: 7 (ref 5.0–8.0)

## 2022-03-24 LAB — LIPASE, BLOOD: Lipase: 21 U/L (ref 11–51)

## 2022-03-24 LAB — PREGNANCY, URINE: Preg Test, Ur: POSITIVE — AB

## 2022-03-24 LAB — ABO/RH: ABO/RH(D): A POS

## 2022-03-24 LAB — HCG, QUANTITATIVE, PREGNANCY: hCG, Beta Chain, Quant, S: 4371 m[IU]/mL — ABNORMAL HIGH (ref <5)

## 2022-03-24 NOTE — ED Triage Notes (Signed)
Pt c/o lower abd pain with some vaginal bleeding that started last night. Pt is in NAD on arrival. ?

## 2022-03-24 NOTE — ED Provider Notes (Signed)
? ?[facility] ?Provider Note ? ? ? Event Date/Time  ? First MD Initiated Contact with Patient 03/24/22 1215   ?  (approximate) ? ? ?History  ? ?Abdominal Pain and Vaginal Bleeding ? ? ?HPI ? ?Jade Allen is a 31 y.o. female who reports having a 75-monthold at home who comes in with concern for vaginal bleeding and abdominal cramping.  She reports symptoms started yesterday.  The bleeding is less today.  And denies any bleeding currently just yesterday.  She reports being told she has subchorionic hemorrhage her last pregnancy so she does want to make sure that there is no issues with this pregnancy.  She reports pain is minimal at this time.  She reports the same partner as the 158-monthld denies any symptoms of STDs. ? ?On review of labs on 03/08/2022 patient had a beta-hCG of 175 ? ? ?Physical Exam  ? ?Triage Vital Signs: ?ED Triage Vitals  ?Enc Vitals Group  ?   BP 03/24/22 1159 108/61  ?   Pulse Rate 03/24/22 1159 63  ?   Resp 03/24/22 1159 16  ?   Temp 03/24/22 1159 98 ?F (36.7 ?C)  ?   Temp Source 03/24/22 1159 Oral  ?   SpO2 03/24/22 1159 97 %  ?   Weight 03/24/22 1201 268 lb 4.8 oz (121.7 kg)  ?   Height 03/24/22 1201 '5\' 3"'$  (1.6 m)  ?   Head Circumference --   ?   Peak Flow --   ?   Pain Score --   ?   Pain Loc --   ?   Pain Edu? --   ?   [place]   ? ? ?Most recent vital signs: ?Vitals:  ? 03/24/22 1159  ?BP: 108/61  ?Pulse: 63  ?Resp: 16  ?Temp: 98 ?F (36.7 ?C)  ?SpO2: 97%  ? ? ? ?General: Awake, no distress.  ?CV:  Good peripheral perfusion.  ?Resp:  Normal effort.  ?Abd:  No distention. Soft and non tender  ?Other:   ? ? ?ED Results / Procedures / Treatments  ? ?Labs ?(all labs ordered are listed, but only abnormal results are displayed) ?Labs Reviewed  ?CBC WITH DIFFERENTIAL/PLATELET - Abnormal; Notable for the following components:  ?    Result Value  ? MCV 75.2 (*)   ? MCH 23.6 (*)   ? All other components within normal limits  ?HCG, QUANTITATIVE, PREGNANCY   ?COMPREHENSIVE METABOLIC PANEL  ?LIPASE, BLOOD  ?PREGNANCY, URINE  ?URINALYSIS, ROUTINE W REFLEX MICROSCOPIC  ?ABO/RH  ? ? ? ? ?RADIOLOGY ?I have reviewed the xray personally and interpreted and I see a fetus ? ?IMPRESSION: ?1. Single live intrauterine gestation measuring 6 weeks 1 day by ?crown-rump length. ?2. Active heart tones at 116 BPM. ? ? ?PROCEDURES: ? ?Critical Care performed: No ? ?Procedures ? ? ?MEDICATIONS ORDERED IN ED: ?Medications - No data to display ? ? ?IMPRESSION / MDM / ASSESSMENT AND PLAN / ED COURSE  ?I reviewed the triage vital signs and the nursing notes. ? ?Patient comes in with vaginal bleeding in the setting of pregnancy.  This concerning for acute life-threatening pathology with a differential including ectopic pregnancy, miscarriage, threatened miscarriage.  Patient denies any significant bleeding at this time to suggest severe anemia but will check hemoglobin.  She denies any vaginal discharge and reports recent STD testing so declines testing today ? ?Patient is a positive ?hCG uptrending to 4000 ?Lipase normal ?UA without evidence of  UTI ?CBC shows stable hemoglobin ?CMP reassuring ? ?IMPRESSION: ?1. Single live intrauterine gestation measuring 6 weeks 1 day by ?crown-rump length. ?2. Active heart tones at 116 BPM. ? ? ?Discussed with patient the chance of miscarriage and this being a threatened miscarriage but no active evidence of it at this time and continue monitoring and to return for increasing pain or bleeding otherwise can follow-up with her OB/GYN ? ? ?FINAL CLINICAL IMPRESSION(S) / ED DIAGNOSES  ? ?Final diagnoses:  ?Threatened miscarriage in early pregnancy  ? ? ? ?Rx / DC Orders  ? ?ED Discharge Orders   ? ? None  ? ?  ? ? ? ?Note:  This document was prepared using Dragon voice recognition software and may include unintentional dictation errors. ?  ?Vanessa [place], MD ?03/24/22 1500 ? ?

## 2022-03-24 NOTE — ED Notes (Signed)
Pt attempted to sign for d/c paperwork/education but topaz frozen.  ?

## 2022-03-24 NOTE — Discharge Instructions (Addendum)
Everything looks good today on your ultrasound to continue to monitor symptoms at home and return if you develop more than 1 pad of bleeding per hour severe pain, fevers  or any other concerns otherwise that you can follow-up with your OB/GYN ? ? ? ?RESSION:  ?1. Single live intrauterine gestation measuring 6 weeks 1 day by  ?crown-rump length.  ?2. Active heart tones at 116 BPM.  ?   ? ?

## 2022-04-05 ENCOUNTER — Emergency Department
Admission: EM | Admit: 2022-04-05 | Discharge: 2022-04-05 | Disposition: A | Discharge status: Home / Self Care | Payer: Medicaid Other | Attending: Student in an Organized Health Care Education/Training Program | Admitting: Student in an Organized Health Care Education/Training Program

## 2022-04-05 ENCOUNTER — Emergency Department: Payer: Medicaid Other

## 2022-04-05 ENCOUNTER — Encounter: Payer: Self-pay | Admitting: Emergency Medicine

## 2022-04-05 ENCOUNTER — Other Ambulatory Visit: Payer: Self-pay

## 2022-04-05 DIAGNOSIS — Z3A08 8 weeks gestation of pregnancy: Secondary | ICD-10-CM | POA: Diagnosis not present

## 2022-04-05 DIAGNOSIS — O209 Hemorrhage in early pregnancy, unspecified: Secondary | ICD-10-CM | POA: Diagnosis present

## 2022-04-05 DIAGNOSIS — N939 Abnormal uterine and vaginal bleeding, unspecified: Secondary | ICD-10-CM

## 2022-04-05 LAB — COMPREHENSIVE METABOLIC PANEL
ALT: 14 U/L (ref 0–44)
AST: 14 U/L — ABNORMAL LOW (ref 15–41)
Albumin: 4.1 g/dL (ref 3.5–5.0)
Alkaline Phosphatase: 42 U/L (ref 38–126)
Anion gap: 6 (ref 5–15)
BUN: 9 mg/dL (ref 6–20)
CO2: 22 mmol/L (ref 22–32)
Calcium: 8.8 mg/dL — ABNORMAL LOW (ref 8.9–10.3)
Chloride: 110 mmol/L (ref 98–111)
Creatinine, Ser: 0.46 mg/dL (ref 0.44–1.00)
GFR, Estimated: >60 mL/min (ref >60)
Glucose, Bld: 100 mg/dL — ABNORMAL HIGH (ref 70–99)
Potassium: 3.5 mmol/L (ref 3.5–5.1)
Sodium: 138 mmol/L (ref 135–145)
Total Bilirubin: 0.8 mg/dL (ref 0.3–1.2)
Total Protein: 6.9 g/dL (ref 6.5–8.1)

## 2022-04-05 LAB — CBC
HCT: 39.9 % (ref 36.0–46.0)
Hemoglobin: 12.7 g/dL (ref 12.0–15.0)
MCH: 24.1 pg — ABNORMAL LOW (ref 26.0–34.0)
MCHC: 31.8 g/dL (ref 30.0–36.0)
MCV: 75.6 fL — ABNORMAL LOW (ref 80.0–100.0)
Platelets: 371 10*3/uL (ref 150–400)
RBC: 5.28 MIL/uL — ABNORMAL HIGH (ref 3.87–5.11)
RDW: 14.5 % (ref 11.5–15.5)
WBC: 7.2 10*3/uL (ref 4.0–10.5)
nRBC: 0 % (ref 0.0–0.2)

## 2022-04-05 LAB — ABO/RH: ABO/RH(D): A POS

## 2022-04-05 LAB — HCG, QUANTITATIVE, PREGNANCY: hCG, Beta Chain, Quant, S: 7346 m[IU]/mL — ABNORMAL HIGH (ref <5)

## 2022-04-05 NOTE — ED Triage Notes (Signed)
Pt reports was excited and jumped up and down and then last pm started with some light vaginal bleeding. Pt reports slight abd discomfort but not a lot. Pt reports is [redacted] weeks pregnant.

## 2022-04-05 NOTE — Discharge Instructions (Signed)
Please follow-up with your OB/GYN this week.  Please return for any new, worsening, or change in symptoms or other concerns.  It was a pleasure caring for you today.

## 2022-04-05 NOTE — ED Notes (Signed)
Urine specimen sent to lab by this RN

## 2022-04-05 NOTE — ED Notes (Signed)
Pt reports small amount of spotting that started last night after she was jumping up and down. Pt states that bleeding is not heavy enough to need to wear a pad, only spotting when she wipes. Pt states last intercourse 1 week ago.   Pt states that she is having minor cramping.   Pt denies urinary symptoms.   Pt denies vaginal discharge.

## 2022-04-05 NOTE — ED Notes (Signed)
dc ppw provided to patient, questions answered, followup and RX information provided. Pt declines VS at time of dc and provides verbal consent for dc. PT ambulatory to lobby alert and oriented at this time.

## 2022-04-05 NOTE — ED Provider Notes (Signed)
[facility] Provider Note    Event Date/Time   First MD Initiated Contact with Patient 04/05/22 1040     (approximate)   History   Vaginal Bleeding, Abdominal Cramping, and Possible Pregnancy   HPI  Jade Allen is a 31 y.o. female G2P1 currently approximately [redacted] weeks pregnant by LMP of 02/07/2022 presents today with vaginal spotting.  Patient reports that she found out the sex of her baby and jumped up and down and excitement, and has since noticed very scant blood when wiping only.  She reports that she had a subchorionic hemorrhage with her first pregnancy and is concerned about this.  She denies any heavy bleeding.  She has not noticed any blood in her underwear.  She has not needed to use a pad.  She has not had any lightheadedness or abdominal pain.  No fevers or chills.  No trauma.  No dyspareunia.  No vaginal discharge besides the scant blood.  No urinary symptoms.     Physical Exam   Triage Vital Signs: ED Triage Vitals  Enc Vitals Group     BP 04/05/22 1005 116/68     Pulse Rate 04/05/22 1005 70     Resp 04/05/22 1005 16     Temp 04/05/22 1005 99.1 F (37.3 C)     Temp Source 04/05/22 1005 Oral     SpO2 04/05/22 1005 99 %     Weight 04/05/22 0938 268 lb 15.4 oz (122 kg)     Height 04/05/22 0938 '5\' 3"'$  (1.6 m)     Head Circumference --      Peak Flow --      Pain Score 04/05/22 0938 2     Pain Loc --      Pain Edu? --      Excl. in [place]? --     Most recent vital signs: Vitals:   04/05/22 1005  BP: 116/68  Pulse: 70  Resp: 16  Temp: 99.1 F (37.3 C)  SpO2: 99%    Physical Exam Vitals and nursing note reviewed.  Constitutional:      General: Awake and alert. No acute distress.    Appearance: Normal appearance. He is well-developed and overweight.  HENT:     Head: Normocephalic and atraumatic.     Mouth/Throat:     Mouth: Mucous membranes are moist.  Eyes:     General: PERRL. Normal EOMs        Right eye: No discharge.         Left eye: No discharge.     Conjunctiva/sclera: Conjunctivae normal.  Cardiovascular:     Rate and Rhythm: Normal rate and regular rhythm.     Pulses: Normal pulses.     Heart sounds: Normal heart sounds Pulmonary:     Effort: Pulmonary effort is normal. No respiratory distress.     Breath sounds: Normal breath sounds.  Abdominal:     Abdomen is soft. There is no abdominal tenderness. No rebound or guarding. No distention. Musculoskeletal:        General: No swelling. Normal range of motion.     Cervical back: Normal range of motion and neck supple.  Lymphadenopathy:     Cervical: No cervical adenopathy.  Skin:    General: Skin is warm and dry.     Capillary Refill: Capillary refill takes less than 2 seconds.     Findings: No rash.  Neurological:     Mental Status: She is alert.  ED Results / Procedures / Treatments   Labs (all labs ordered are listed, but only abnormal results are displayed) Labs Reviewed  COMPREHENSIVE METABOLIC PANEL - Abnormal; Notable for the following components:      Result Value   Glucose, Bld 100 (*)    Calcium 8.8 (*)    AST 14 (*)    All other components within normal limits  CBC - Abnormal; Notable for the following components:   RBC 5.28 (*)    MCV 75.6 (*)    MCH 24.1 (*)    All other components within normal limits  HCG, QUANTITATIVE, PREGNANCY - Abnormal; Notable for the following components:   hCG, Beta Chain, Quant, S 7,346 (*)    All other components within normal limits  ABO/RH     EKG     RADIOLOGY     PROCEDURES:  Critical Care performed:   Procedures   MEDICATIONS ORDERED IN ED: Medications - No data to display   IMPRESSION / MDM / [place] / ED COURSE  I reviewed the triage vital signs and the nursing notes.   Differential diagnosis includes, but is not limited to, spontaneous/inevitable abortion, subchorionic hemorrhage, placental abruption, placental previa.  Patient is awake and alert,  hemodynamically stable and afebrile.  Her abdomen is soft and nontender throughout.  Labs are obtained and overall reassuring.  Stable H&H.  hCG is elevated to 7346, with expected increase from previous obtained on 03/24/22.  Recommended repeat hCG this week in 2 days if she continues to have bleeding.  Rh+.  Ultrasound reveals an intrauterine pregnancy without subchorionic hemorrhage, normal adnexae.  We discussed importance of close outpatient follow-up with her OB/GYN and strict return precautions.  Patient understands and agrees with plan.  Discharged in stable condition.      FINAL CLINICAL IMPRESSION(S) / ED DIAGNOSES   Final diagnoses:  Vaginal bleeding     Rx / DC Orders   ED Discharge Orders     None        Note:  This document was prepared using Dragon voice recognition software and may include unintentional dictation errors.   Emeline Gins 04/05/22 1301    Merlyn Lot, MD 04/05/22 1352

## 2022-04-06 ENCOUNTER — Other Ambulatory Visit: Payer: Medicaid Other

## 2022-04-09 ENCOUNTER — Telehealth: Payer: Medicaid Other

## 2022-04-30 ENCOUNTER — Encounter: Payer: Medicaid Other | Admitting: Obstetrics

## 2022-06-06 ENCOUNTER — Other Ambulatory Visit: Payer: Self-pay

## 2022-06-06 ENCOUNTER — Encounter: Payer: Self-pay | Admitting: Emergency Medicine

## 2022-06-06 ENCOUNTER — Emergency Department
Admission: EM | Admit: 2022-06-06 | Discharge: 2022-06-06 | Disposition: A | Discharge status: Home / Self Care | Payer: Medicaid Other | Attending: Emergency Medicine | Admitting: Emergency Medicine

## 2022-06-06 DIAGNOSIS — T782XXA Anaphylactic shock, unspecified, initial encounter: Secondary | ICD-10-CM

## 2022-06-06 DIAGNOSIS — T63441A Toxic effect of venom of bees, accidental (unintentional), initial encounter: Secondary | ICD-10-CM | POA: Insufficient documentation

## 2022-06-06 LAB — POC URINE PREG, ED: Preg Test, Ur: NEGATIVE

## 2022-06-06 MED ORDER — EPINEPHRINE 0.3 MG/0.3ML IJ SOAJ: 0.3000 mg | INTRAMUSCULAR | 1 refills | Status: AC | PRN

## 2022-06-06 MED ORDER — METHYLPREDNISOLONE SODIUM SUCC 125 MG IJ SOLR
125.0000 mg | Freq: Once | INTRAMUSCULAR | Status: AC
Start: 2022-06-06 — End: 2022-06-06
  Administered 2022-06-06: 125 mg via INTRAVENOUS
  Filled 2022-06-06: qty 2

## 2022-06-06 MED ORDER — EPINEPHRINE 0.3 MG/0.3ML IJ SOAJ
0.3000 mg | Freq: Once | INTRAMUSCULAR | Status: AC
Start: 2022-06-06 — End: 2022-06-06
  Administered 2022-06-06: 0.3 mg via INTRAMUSCULAR
  Filled 2022-06-06: qty 0.3

## 2022-06-06 MED ORDER — DIPHENHYDRAMINE HCL 25 MG PO CAPS
50.0000 mg | ORAL_CAPSULE | Freq: Once | ORAL | Status: AC
  Administered 2022-06-06: 50 mg via ORAL
  Filled 2022-06-06: qty 2

## 2022-06-06 MED ORDER — FAMOTIDINE IN NACL 20-0.9 MG/50ML-% IV SOLN
20.0000 mg | Freq: Once | INTRAVENOUS | Status: AC
  Administered 2022-06-06: 20 mg via INTRAVENOUS
  Filled 2022-06-06: qty 50

## 2022-06-06 NOTE — ED Triage Notes (Signed)
Pt states was stung by a bee approx 15 min ago. Pt it itching all over, no angioedema noted. Pt with redness noted to face, back, shoulders and arm where stung. No nausea noted.

## 2022-06-06 NOTE — ED Provider Notes (Signed)
[facility] Provider Note    Event Date/Time   First MD Initiated Contact with Patient 06/06/22 1909     (approximate)   History   Chief Complaint Allergic Reaction   HPI  Jade Allen is a 31 y.o. female with no significant past medical history who presents to the ED complaining of allergic reaction.  Patient reports that just prior to arrival she was stung by a bee on her right wrist.  Shortly afterwards she reports red itchy rash spreading across her chest, back, arms, and face.  She denies any associated nausea, vomiting, diarrhea, lightheadedness, dizziness, chest pain, or shortness of breath.  She reports similar reaction to bee sting when she was a child, but has never required an EpiPen.  She has not taken anything for her symptoms prior to arrival.     Physical Exam   Triage Vital Signs: ED Triage Vitals  Enc Vitals Group     BP 06/06/22 1902 (!) 123/97     Pulse Rate 06/06/22 1902 85     Resp 06/06/22 1902 16     Temp 06/06/22 1902 98.8 F (37.1 C)     Temp Source 06/06/22 1902 Oral     SpO2 06/06/22 1902 100 %     Weight 06/06/22 1903 258 lb (117 kg)     Height 06/06/22 1903 '5\' 3"'$  (1.6 m)     Head Circumference --      Peak Flow --      Pain Score 06/06/22 1903 10     Pain Loc --      Pain Edu? --      Excl. in [place]? --     Most recent vital signs: Vitals:   06/06/22 2200 06/06/22 2230  BP: 116/75 119/86  Pulse: 69 71  Resp: 20 19  Temp:    SpO2: 98% 97%    Constitutional: Alert and oriented. Eyes: Conjunctivae are normal. Head: Atraumatic. Nose: No congestion/rhinnorhea. Mouth/Throat: Mucous membranes are moist.  Cardiovascular: Normal rate, regular rhythm. Grossly normal heart sounds.  2+ radial pulses bilaterally. Respiratory: Normal respiratory effort.  No retractions. Lungs CTAB. Gastrointestinal: Soft and nontender. No distention. Musculoskeletal: No lower extremity tenderness nor edema.  Diffuse hives to chest, arms,  back, and face. Neurologic:  Normal speech and language. No gross focal neurologic deficits are appreciated.    ED Results / Procedures / Treatments   Labs (all labs ordered are listed, but only abnormal results are displayed) Labs Reviewed  POC URINE PREG, ED     PROCEDURES:  Critical Care performed: No  Procedures   MEDICATIONS ORDERED IN ED: Medications  diphenhydrAMINE (BENADRYL) capsule 50 mg (50 mg Oral Given 06/06/22 1923)  EPINEPHrine (EPI-PEN) injection 0.3 mg (0.3 mg Intramuscular Given 06/06/22 2004)  methylPREDNISolone sodium succinate (SOLU-MEDROL) 125 mg/2 mL injection 125 mg (125 mg Intravenous Given 06/06/22 2003)  famotidine (PEPCID) IVPB 20 mg premix (0 mg Intravenous Stopped 06/06/22 2034)     IMPRESSION / MDM / ASSESSMENT AND PLAN / ED COURSE  I reviewed the triage vital signs and the nursing notes.                              31 y.o. female with no significant past medical history presents to the ED complaining of diffuse itchy raised rash after being stung by a bee on her right wrist just prior to arrival.  Patient's presentation is most consistent with  acute, uncomplicated illness.  Differential diagnosis includes, but is not limited to, anaphylactic reaction, hives, angioedema.  Patient well-appearing and in no acute distress, vital signs are unremarkable.  She has diffuse hives but no other systems involved, does not meet criteria for anaphylaxis and we will hold off on EpiPen.  We will give dose of Benadryl for hives and observe, plan to administer IM epinephrine for any worsening symptoms.  About 30 minutes after Benadryl administration, patient reports worsening rash and facial swelling, no oral swelling noted on examination.  She was given IM epinephrine along with IV Solu-Medrol and IV Pepcid.  She had significant improvement in symptoms shortly afterwards.  She has now been observed for over 3 hours, is requesting to be discharged home so that  she may care for her 19-year-old child.  She will be prescribed EpiPen and was counseled to return to the ED for any recurrent symptoms of anaphylaxis.  Patient agrees with plan.      FINAL CLINICAL IMPRESSION(S) / ED DIAGNOSES   Final diagnoses:  Anaphylaxis, initial encounter  Bee sting, accidental or unintentional, initial encounter     Rx / DC Orders   ED Discharge Orders          Ordered    EPINEPHrine (EPIPEN 2-PAK) 0.3 mg/0.3 mL IJ SOAJ injection  As needed        06/06/22 2320             Note:  This document was prepared using Dragon voice recognition software and may include unintentional dictation errors.   Blake Divine, MD 06/06/22 2322

## 2022-07-13 ENCOUNTER — Encounter: Payer: Self-pay | Admitting: Emergency Medicine

## 2022-07-13 ENCOUNTER — Other Ambulatory Visit: Payer: Self-pay

## 2022-07-13 ENCOUNTER — Emergency Department
Admission: EM | Admit: 2022-07-13 | Discharge: 2022-07-13 | Disposition: A | Discharge status: Home / Self Care | Payer: Medicaid Other | Attending: Emergency Medicine | Admitting: Emergency Medicine

## 2022-07-13 DIAGNOSIS — O26891 Other specified pregnancy related conditions, first trimester: Secondary | ICD-10-CM | POA: Diagnosis present

## 2022-07-13 DIAGNOSIS — O09211 Supervision of pregnancy with history of pre-term labor, first trimester: Secondary | ICD-10-CM

## 2022-07-13 DIAGNOSIS — Z3A01 Less than 8 weeks gestation of pregnancy: Secondary | ICD-10-CM | POA: Diagnosis not present

## 2022-07-13 DIAGNOSIS — Z8751 Personal history of pre-term labor: Secondary | ICD-10-CM | POA: Diagnosis not present

## 2022-07-13 DIAGNOSIS — R8271 Bacteriuria: Secondary | ICD-10-CM | POA: Diagnosis not present

## 2022-07-13 DIAGNOSIS — O210 Mild hyperemesis gravidarum: Secondary | ICD-10-CM | POA: Insufficient documentation

## 2022-07-13 LAB — URINALYSIS, ROUTINE W REFLEX MICROSCOPIC
Bilirubin Urine: NEGATIVE
Glucose, UA: NEGATIVE mg/dL
Hgb urine dipstick: NEGATIVE
Ketones, ur: NEGATIVE mg/dL
Nitrite: NEGATIVE
Protein, ur: NEGATIVE mg/dL
Specific Gravity, Urine: 1.024 (ref 1.005–1.030)
pH: 6 (ref 5.0–8.0)

## 2022-07-13 LAB — POC URINE PREG, ED: Preg Test, Ur: POSITIVE — AB

## 2022-07-13 LAB — HCG, QUANTITATIVE, PREGNANCY: hCG, Beta Chain, Quant, S: 697 m[IU]/mL — ABNORMAL HIGH (ref <5)

## 2022-07-13 MED ORDER — CEPHALEXIN 500 MG PO CAPS: 500.0000 mg | ORAL_CAPSULE | Freq: Three times a day (TID) | ORAL | 0 refills | Status: AC

## 2022-07-13 NOTE — ED Triage Notes (Signed)
"  I think I'm pregnant"  Patient states she missed a period.  Also c/o nausea.  STates has not taken a home pregnancy test and wants to find out how far a long she is.  LMP:  06/10/2022.  G:2 P:1

## 2022-07-13 NOTE — ED Provider Notes (Signed)
[facility] Provider Note    None    (approximate)   History   Chief Complaint Nausea   HPI Jade Allen is a 31 y.o. female, history of ADHD, presents emergency department for evaluation of suspected pregnancy.  Patient states that her LMP was 06/10/2022.  She states that she has been feeling nauseous over the past few days and feels "pretty sure I am pregnant", but has not taken a formal test yet.  She states that she just wants to have a test to know how far along she is.  Endorses some nausea, but states that she has a prescription for this.  Denies fever/chills, chest pain, shortness of breath, abdominal pain, flank pain, dysuria, vomiting, rash/lesions, diarrhea, vaginal discharge, vaginal bleeding, or dizziness/lightheadedness.  History Limitations: No limitations.        Physical Exam  Triage Vital Signs: ED Triage Vitals [07/13/22 0707]  Enc Vitals Group     BP 119/86     Pulse Rate 82     Resp 16     Temp 98 F (36.7 C)     Temp src      SpO2 94 %     Weight 257 lb 15 oz (117 kg)     Height '5\' 3"'$  (1.6 m)     Head Circumference      Peak Flow      Pain Score 0     Pain Loc      Pain Edu?      Excl. in [place]?     Most recent vital signs: Vitals:   07/13/22 0707  BP: 119/86  Pulse: 82  Resp: 16  Temp: 98 F (36.7 C)  SpO2: 94%    General: Awake, NAD.  Skin: Warm, dry. No rashes or lesions.  Eyes: PERRL. Conjunctivae normal.  CV: Good peripheral perfusion.  Resp: Normal effort.  Abd: Soft, non-tender. No distention.  Neuro: At baseline. No gross neurological deficits.  Musculoskeletal: Normal ROM of all extremities.   Focused Exam: N/A.  Physical Exam    ED Results / Procedures / Treatments  Labs (all labs ordered are listed, but only abnormal results are displayed) Labs Reviewed  URINALYSIS, ROUTINE W REFLEX MICROSCOPIC - Abnormal; Notable for the following components:      Result Value   Color, Urine YELLOW (*)     APPearance HAZY (*)    Leukocytes,Ua MODERATE (*)    Bacteria, UA FEW (*)    All other components within normal limits  HCG, QUANTITATIVE, PREGNANCY - Abnormal; Notable for the following components:   hCG, Beta Chain, Quant, S 697 (*)    All other components within normal limits  POC URINE PREG, ED - Abnormal; Notable for the following components:   Preg Test, Ur POSITIVE (*)    All other components within normal limits     EKG N/A.   RADIOLOGY  ED Provider Interpretation: N/A.  No results found.  PROCEDURES:  Critical Care performed: N/A.  Procedures    MEDICATIONS ORDERED IN ED: Medications - No data to display   IMPRESSION / MDM / [place] / ED COURSE  I reviewed the triage vital signs and the nursing notes.                              Differential diagnosis includes, but is not limited to, pregnancy, morning sickness.    Assessment/Plan Patient presents for evaluation  of suspected pregnancy.  Point-of-care urine pregnancy test positive.  Quantitative hCG 697, consistent with likely 3-week pregnancy.  She states that she has some nausea, likely secondary to morning sickness, but does have some medication at home for this and does not want any further medications.  Her urinalysis does show some moderate leukocytes and few bacteria.  She is not exhibiting any urinary symptoms at this time.  We will treat her for asymptomatic bacteriuria with prescription for cephalexin.  Patient was amenable to this.  Advised her to follow-up with her OB/GYN.  Will discharge.  Provided the patient with anticipatory guidance, return precautions, and educational material. Encouraged the patient to return to the emergency department at any time if they begin to experience any new or worsening symptoms. Patient expressed understanding and agreed with the plan.   Patient's presentation is most consistent with acute complicated illness / injury requiring diagnostic workup.        FINAL CLINICAL IMPRESSION(S) / ED DIAGNOSES   Final diagnoses:  Current pregnancy with history of pre-term labor in first trimester  Morning sickness  Asymptomatic bacteriuria during pregnancy     Rx / DC Orders   ED Discharge Orders          Ordered    cephALEXin (KEFLEX) 500 MG capsule  3 times daily        07/13/22 0934             Note:  This document was prepared using Dragon voice recognition software and may include unintentional dictation errors.   Teodoro Spray, [place] 07/13/22 1618    Nance Pear, MD 07/14/22 Joen Laura

## 2022-07-13 NOTE — Discharge Instructions (Signed)
-  Take all of your antibiotics as prescribed in order to treat the possible infection noted in your urine.  -Please follow-up with your OB/GYN to schedule initial ultrasound, as discussed.  -Return to the emergency department anytime if you begin to experience any new or worsening symptoms.

## 2022-07-13 NOTE — ED Notes (Signed)
See triage note  Presents with some nausea and fatigue  Denies any fever or cough  Thinks she might be pregnant

## 2022-07-23 ENCOUNTER — Encounter: Payer: Self-pay | Admitting: Emergency Medicine

## 2022-07-23 ENCOUNTER — Other Ambulatory Visit: Payer: Self-pay

## 2022-07-23 ENCOUNTER — Emergency Department
Admission: EM | Admit: 2022-07-23 | Discharge: 2022-07-23 | Disposition: A | Discharge status: Home / Self Care | Payer: Medicaid Other | Attending: Emergency Medicine | Admitting: Emergency Medicine

## 2022-07-23 ENCOUNTER — Emergency Department: Payer: Medicaid Other

## 2022-07-23 DIAGNOSIS — O209 Hemorrhage in early pregnancy, unspecified: Secondary | ICD-10-CM | POA: Insufficient documentation

## 2022-07-23 DIAGNOSIS — O4690 Antepartum hemorrhage, unspecified, unspecified trimester: Secondary | ICD-10-CM

## 2022-07-23 DIAGNOSIS — Z3A01 Less than 8 weeks gestation of pregnancy: Secondary | ICD-10-CM | POA: Insufficient documentation

## 2022-07-23 DIAGNOSIS — N939 Abnormal uterine and vaginal bleeding, unspecified: Secondary | ICD-10-CM

## 2022-07-23 LAB — HCG, QUANTITATIVE, PREGNANCY: hCG, Beta Chain, Quant, S: 34301 m[IU]/mL — ABNORMAL HIGH (ref <5)

## 2022-07-23 LAB — URINALYSIS, ROUTINE W REFLEX MICROSCOPIC
Bilirubin Urine: NEGATIVE
Glucose, UA: NEGATIVE mg/dL
Ketones, ur: NEGATIVE mg/dL
Leukocytes,Ua: NEGATIVE
Nitrite: NEGATIVE
Protein, ur: NEGATIVE mg/dL
Specific Gravity, Urine: 1.017 (ref 1.005–1.030)
pH: 6 (ref 5.0–8.0)

## 2022-07-23 LAB — COMPREHENSIVE METABOLIC PANEL
ALT: 31 U/L (ref 0–44)
AST: 19 U/L (ref 15–41)
Albumin: 4.2 g/dL (ref 3.5–5.0)
Alkaline Phosphatase: 44 U/L (ref 38–126)
Anion gap: 8 (ref 5–15)
BUN: 9 mg/dL (ref 6–20)
CO2: 21 mmol/L — ABNORMAL LOW (ref 22–32)
Calcium: 9 mg/dL (ref 8.9–10.3)
Chloride: 109 mmol/L (ref 98–111)
Creatinine, Ser: 0.5 mg/dL (ref 0.44–1.00)
GFR, Estimated: >60 mL/min (ref >60)
Glucose, Bld: 100 mg/dL — ABNORMAL HIGH (ref 70–99)
Potassium: 3.8 mmol/L (ref 3.5–5.1)
Sodium: 138 mmol/L (ref 135–145)
Total Bilirubin: 0.4 mg/dL (ref 0.3–1.2)
Total Protein: 7.1 g/dL (ref 6.5–8.1)

## 2022-07-23 LAB — CBC WITH DIFFERENTIAL/PLATELET
Abs Immature Granulocytes: 0.02 10*3/uL (ref 0.00–0.07)
Basophils Absolute: 0 10*3/uL (ref 0.0–0.1)
Basophils Relative: 0 %
Eosinophils Absolute: 0.4 10*3/uL (ref 0.0–0.5)
Eosinophils Relative: 4 %
HCT: 39 % (ref 36.0–46.0)
Hemoglobin: 12.3 g/dL (ref 12.0–15.0)
Immature Granulocytes: 0 %
Lymphocytes Relative: 23 %
Lymphs Abs: 2.2 10*3/uL (ref 0.7–4.0)
MCH: 24.3 pg — ABNORMAL LOW (ref 26.0–34.0)
MCHC: 31.5 g/dL (ref 30.0–36.0)
MCV: 77.1 fL — ABNORMAL LOW (ref 80.0–100.0)
Monocytes Absolute: 0.7 10*3/uL (ref 0.1–1.0)
Monocytes Relative: 7 %
Neutro Abs: 6 10*3/uL (ref 1.7–7.7)
Neutrophils Relative %: 66 %
Platelets: 358 10*3/uL (ref 150–400)
RBC: 5.06 MIL/uL (ref 3.87–5.11)
RDW: 15.1 % (ref 11.5–15.5)
WBC: 9.3 10*3/uL (ref 4.0–10.5)
nRBC: 0 % (ref 0.0–0.2)

## 2022-07-23 NOTE — Discharge Instructions (Addendum)
There is a small subchorionic hemorrhage.  Also as we mentioned there is no embryo that they can see on the ultrasound.  I discussed you with Dr. Marcelline Mates who is on-call for [facility] and [facility].  Usually Princella Ion follows up with [facility].  Dr. Marcelline Mates feels you should follow-up with [facility] in about 10 days.  Please return here sooner if you develop a fever or lightheadedness or heavy bleeding or bad cramping.

## 2022-07-23 NOTE — ED Triage Notes (Addendum)
Patient ambulatory to triage with steady gait, without difficulty or distress noted; pt reports approx [redacted]wks pregnant with vag bleeding and some back/lower abd pain; G3P1; recent miscarriage in June; pt chart indicates A+ blood type

## 2022-07-23 NOTE — ED Provider Notes (Signed)
[facility] Provider Note    Event Date/Time   First MD Initiated Contact with Patient 07/23/22 [phone]     (approximate)   History   Vaginal Bleeding   HPI  Jade Allen is a 31 y.o. female who is [redacted] weeks pregnant with her third pregnancy 1 miscarriage 1 live child.  Patient had bleeding with each pregnancy.  She is having some bleeding now.  Its not heavy.  She is not having any cramping.      Physical Exam   Triage Vital Signs: ED Triage Vitals  Enc Vitals Group     BP 07/23/22 0646 111/67     Pulse Rate 07/23/22 0646 63     Resp 07/23/22 0646 20     Temp 07/23/22 0646 98.7 F (37.1 C)     Temp src --      SpO2 07/23/22 0646 98 %     Weight 07/23/22 0643 260 lb (117.9 kg)     Height 07/23/22 0643 '5\' 3"'$  (1.6 m)     Head Circumference --      Peak Flow --      Pain Score 07/23/22 0642 2     Pain Loc --      Pain Edu? --      Excl. in [place]? --     Most recent vital signs: Vitals:   07/23/22 0830 07/23/22 0902  BP: 110/87 113/65  Pulse: (!) 58 (!) 55  Resp:    Temp:    SpO2: 100% 100%     General: Awake, no distress.  CV:  Good peripheral perfusion.  Heart regular rate and rhythm no audible murmurs Resp:  Normal effort.  Lungs are clear Abd:  No distention.  Soft and nontender GYN: Some old blood very small amount in the vagina os is closed there is no cervical motion tenderness no mass no active bleeding Extremities with no edema  ED Results / Procedures / Treatments   Labs (all labs ordered are listed, but only abnormal results are displayed) Labs Reviewed  HCG, QUANTITATIVE, PREGNANCY - Abnormal; Notable for the following components:      Result Value   hCG, Beta Chain, Quant, S 34,301 (*)    All other components within normal limits  URINALYSIS, ROUTINE W REFLEX MICROSCOPIC - Abnormal; Notable for the following components:   Color, Urine YELLOW (*)    APPearance HAZY (*)    Hgb urine dipstick MODERATE (*)    Bacteria, UA  RARE (*)    All other components within normal limits  CBC WITH DIFFERENTIAL/PLATELET - Abnormal; Notable for the following components:   MCV 77.1 (*)    MCH 24.3 (*)    All other components within normal limits  COMPREHENSIVE METABOLIC PANEL - Abnormal; Notable for the following components:   CO2 21 (*)    Glucose, Bld 100 (*)    All other components within normal limits     EKG     RADIOLOGY Ultrasound shows no embryo no heart rate small subchorionic hemorrhage possible failed pregnancy recommend follow-up in 10 to 14 days   PROCEDURES:  Critical Care performed:   Procedures   MEDICATIONS ORDERED IN ED: Medications - No data to display   IMPRESSION / MDM / [place] / ED COURSE  I reviewed the triage vital signs and the nursing notes.  Discussed patient with Dr. Marcelline Mates.  She is on-call for [place].  They can follow-up at [facility] in 10 to 14  days repeat ultrasound.  Return sooner for heavy bleeding etc.   Patient's presentation is most consistent with acute problem could potentially result in threat to life  The patient is on the cardiac monitor to evaluate for evidence of arrhythmia and/or significant heart rate changes.  None have been seen      FINAL CLINICAL IMPRESSION(S) / ED DIAGNOSES   Final diagnoses:  Vaginal bleeding in pregnancy     Rx / DC Orders   ED Discharge Orders     None        Note:  This document was prepared using Dragon voice recognition software and may include unintentional dictation errors.   Nena Polio, MD 07/23/22 1122

## 2022-08-01 IMAGING — US US OB < 14 WEEKS - US OB TV
1 series · 13 of 28 positions shown · non-contrast
Comparison: None.

CLINICAL DATA: Vaginal bleeding.

EXAM:
OBSTETRIC <14 WK US AND TRANSVAGINAL OB US
TECHNIQUE: Both transabdominal and transvaginal ultrasound examinations were
performed for complete evaluation of the gestation as well as the
maternal uterus, adnexal regions, and pelvic cul-de-sac.
Transvaginal technique was performed to assess early pregnancy.

[Series 1: us ob less than 14 weeks with ob transvaginal · 142 acquisitions, 13 frames shown]
[im 6/142]
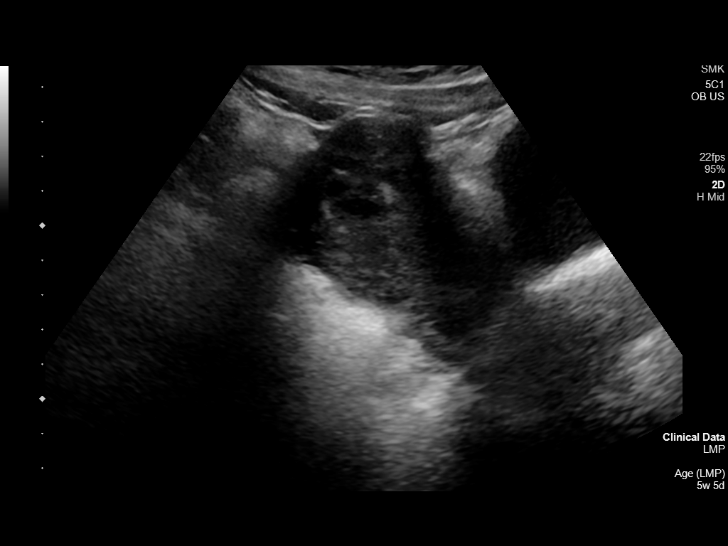
[im 16/142]
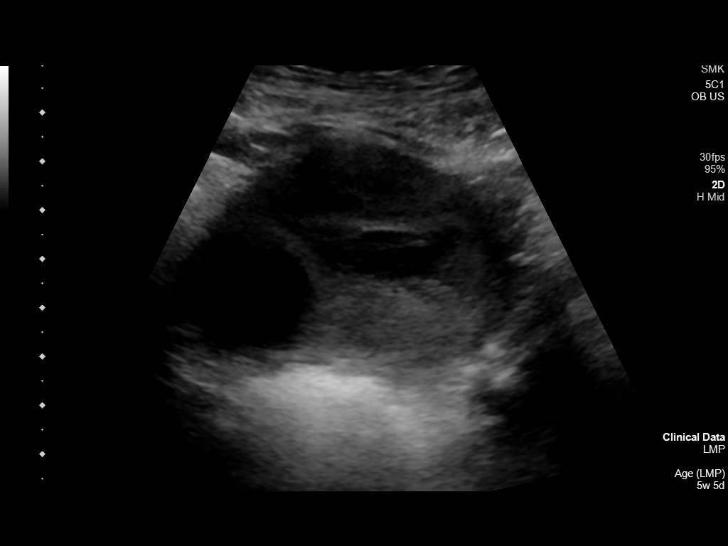
[im 27/142]
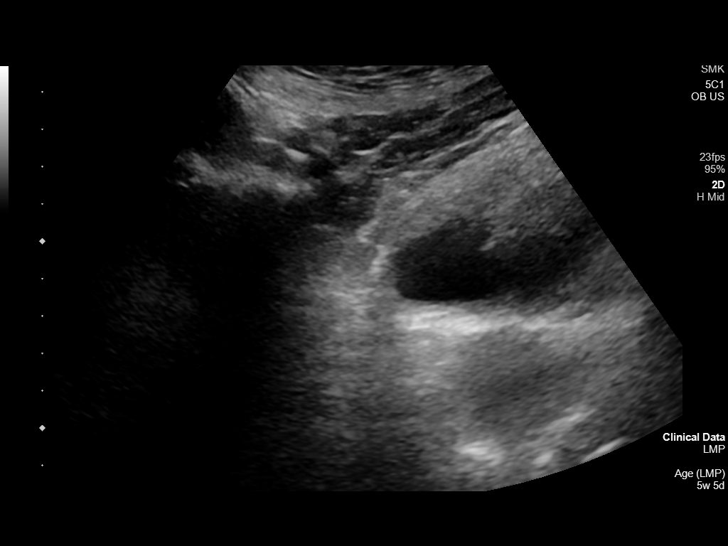
[im 37/142]
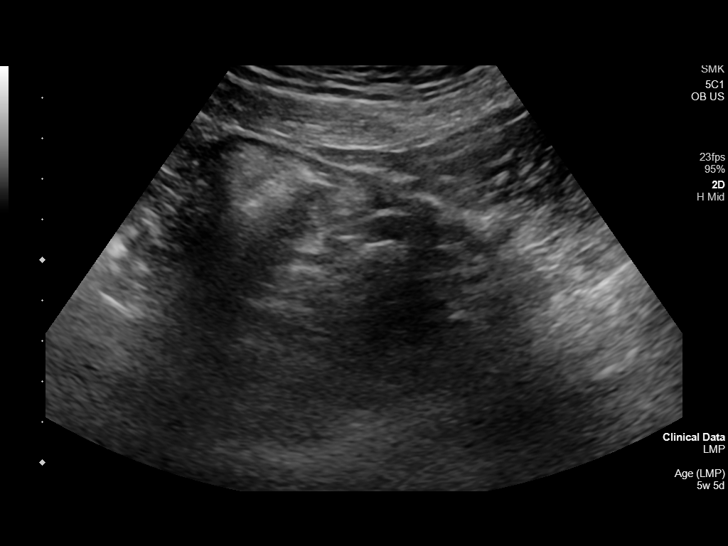
[im 48/142]
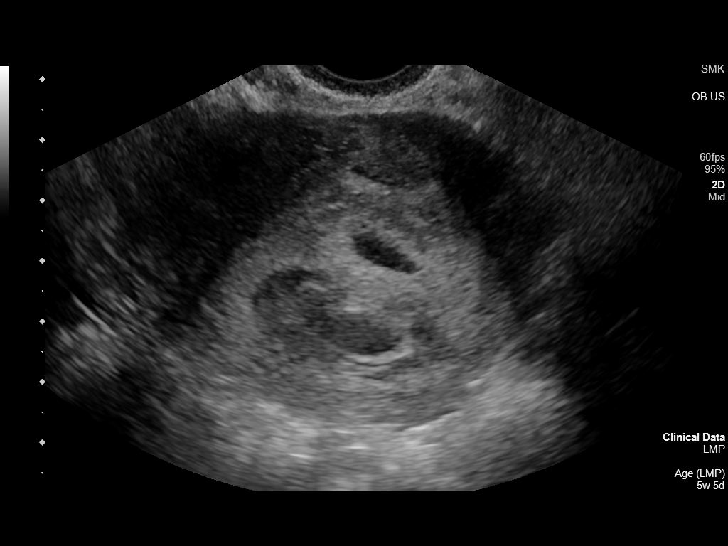
[im 58/142]
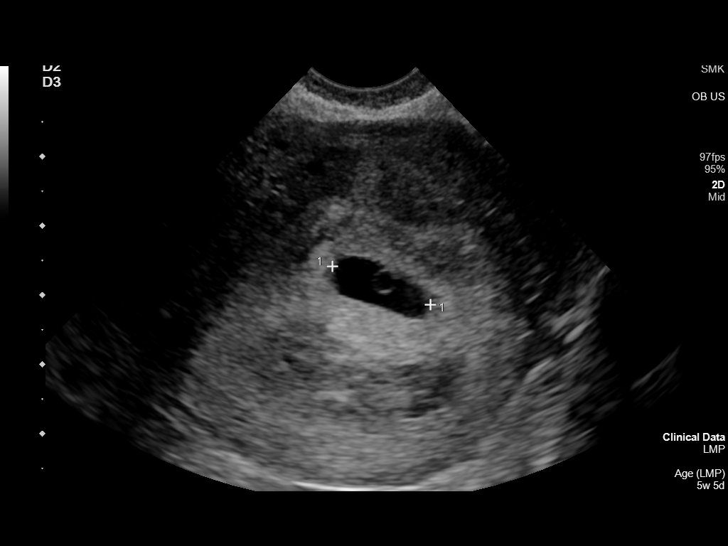
[im 74/142]
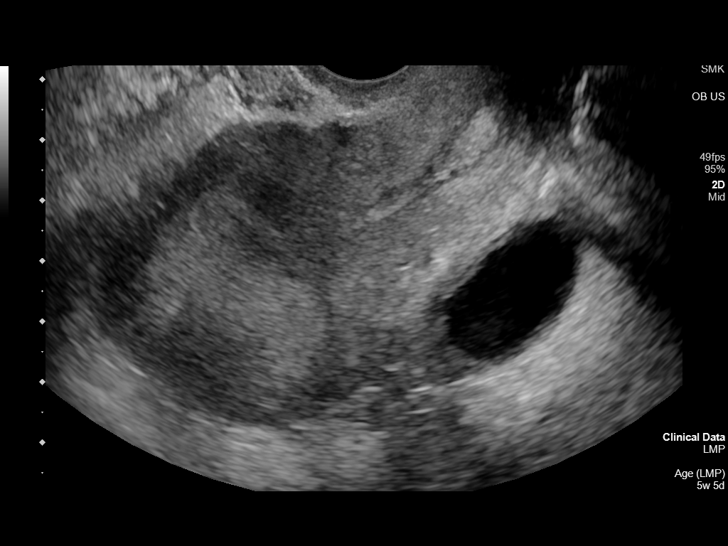
[im 84/142]
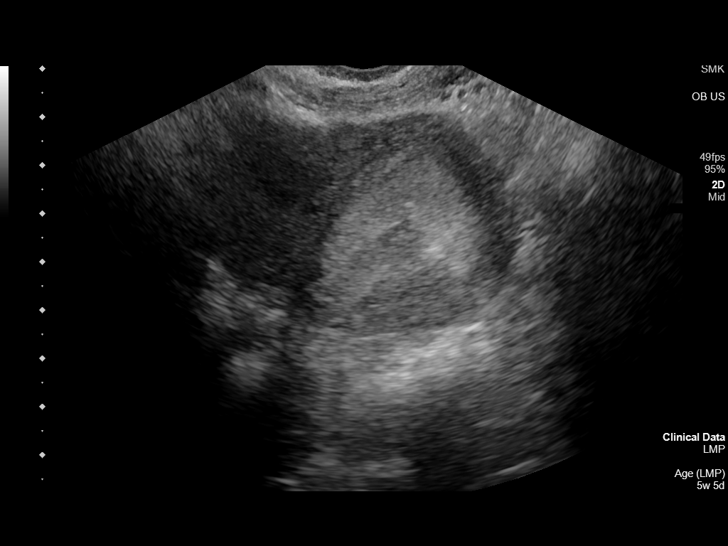
[im 95/142]
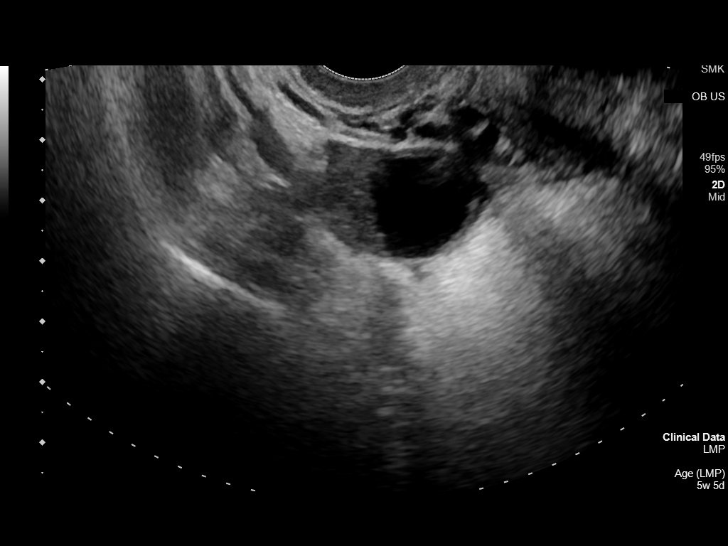
[im 105/142]
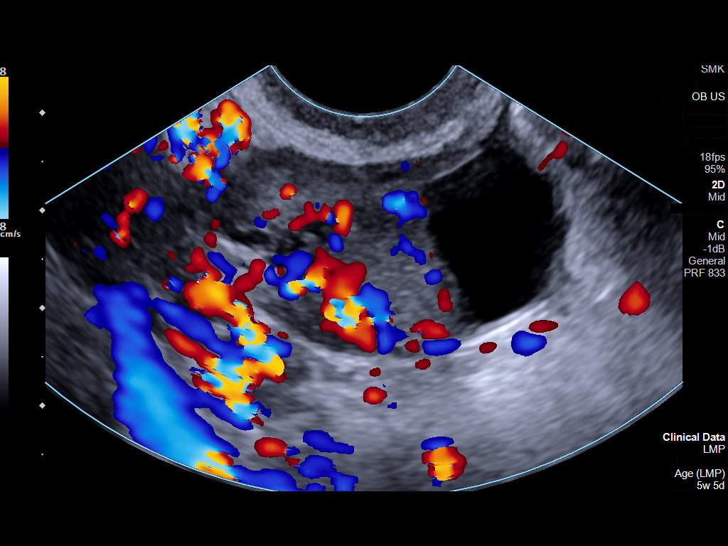
[im 115/142]
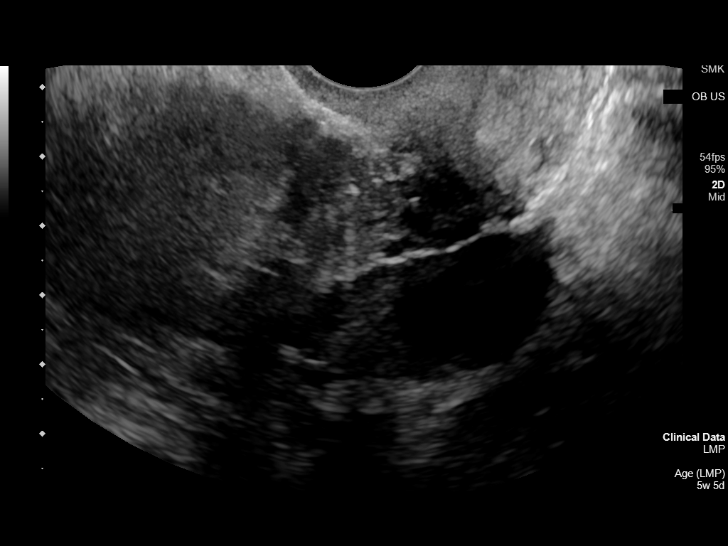
[im 126/142]
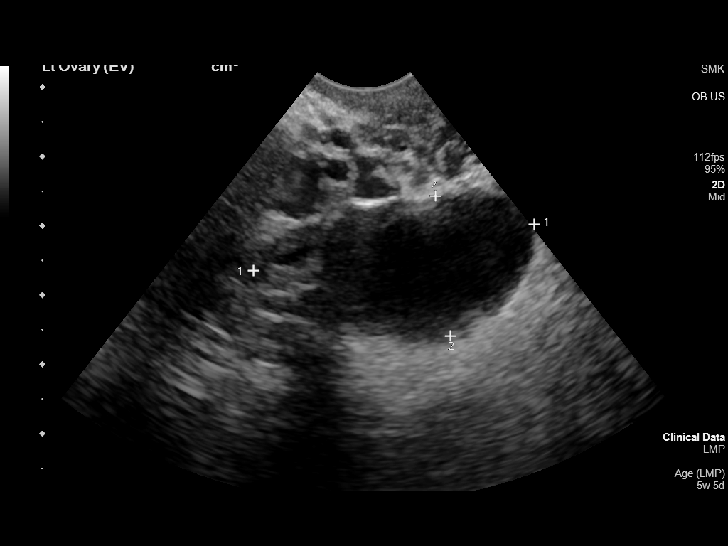
[im 136/142]
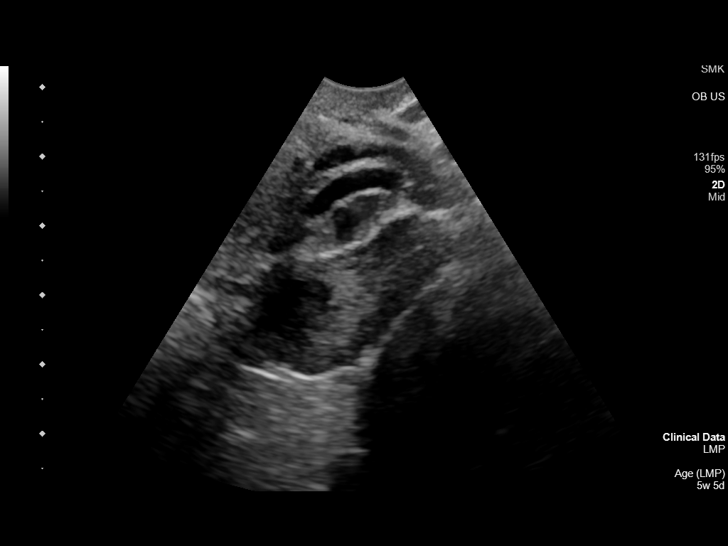

[13 of 28 positions shown; findings below may reference images not displayed]

FINDINGS: Intrauterine gestational sac: Single

Yolk sac:  Yes

Embryo:  Not visualize

MSD: 11.4 mm   5 w   6 d

Subchorionic hemorrhage:  None visualized.

Maternal uterus/adnexae:

Subchorionic hemorrhage: Moderate size subchorionic hemorrhage
identified. This measures 3.6 by 2.2 x 1.2 cm (volume = 5 cm^3).

Right ovary: Appears normal. Dominant cyst measures 2.3 x 1.6 x
cm

Left ovary: Appears normal containing a dominant follicle measuring
2.9 x 1.9 x 2.3 cm.

Other :Right uterine fibroid measures 3.7 x 3.4 x 3.7 cm

Free fluid:  None
IMPRESSION: 1. Probable early intrauterine gestational sac containing a yolk
sac, but no fetal pole, or cardiac activity yet visualized.
Recommend follow-up quantitative B-HCG levels and follow-up US in 14
days to assess viability. This recommendation follows SRU consensus
guidelines: Diagnostic Criteria for Nonviable Pregnancy Early in the
First Trimester. N Engl J Med 0068; [DATE].
2. Moderate size, 5 cc, subchorionic hemorrhage.

## 2022-08-02 ENCOUNTER — Other Ambulatory Visit: Payer: Self-pay

## 2022-08-02 ENCOUNTER — Other Ambulatory Visit: Payer: Medicaid Other

## 2022-08-02 DIAGNOSIS — O209 Hemorrhage in early pregnancy, unspecified: Secondary | ICD-10-CM

## 2022-08-10 ENCOUNTER — Ambulatory Visit: Payer: Medicaid Other

## 2023-02-11 NOTE — Telephone Encounter (Signed)
This encounter was created in error - please disregard.

## 2023-03-10 ENCOUNTER — Telehealth: Payer: Self-pay

## 2023-03-10 NOTE — Telephone Encounter (Signed)
PP nurse called patient for a home visit. Patient declined at this time. She wants to limit visits with newborn and does not feel any need for a postpartum visit at this time. Nurse respects patient's wishes, informed her to call back if she changes her mind.

## 2023-08-10 ENCOUNTER — Ambulatory Visit: Payer: Medicaid Other | Admitting: Nurse Practitioner

## 2024-02-08 IMAGING — US US OB < 14 WEEKS - US OB TV
1 series · 14 of 28 positions shown · non-contrast
Comparison: None Available.

CLINICAL DATA: Vaginal bleeding in pregnancy

EXAM:
OBSTETRIC <14 WK US AND TRANSVAGINAL OB US
TECHNIQUE: Both transabdominal and transvaginal ultrasound examinations were
performed for complete evaluation of the gestation as well as the
maternal uterus, adnexal regions, and pelvic cul-de-sac.
Transvaginal technique was performed to assess early pregnancy.

[Series 1: us ob less than 14 weeks with ob transvaginal · 14 of 82 slices shown]
[im 4/82]
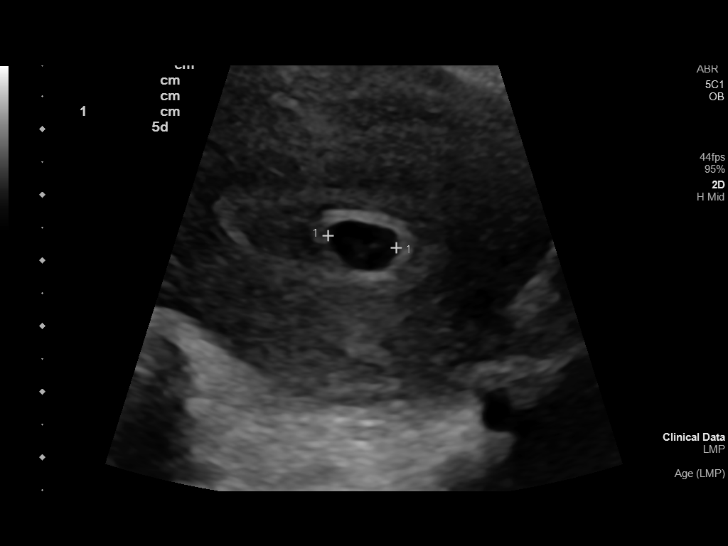
[im 10/82]
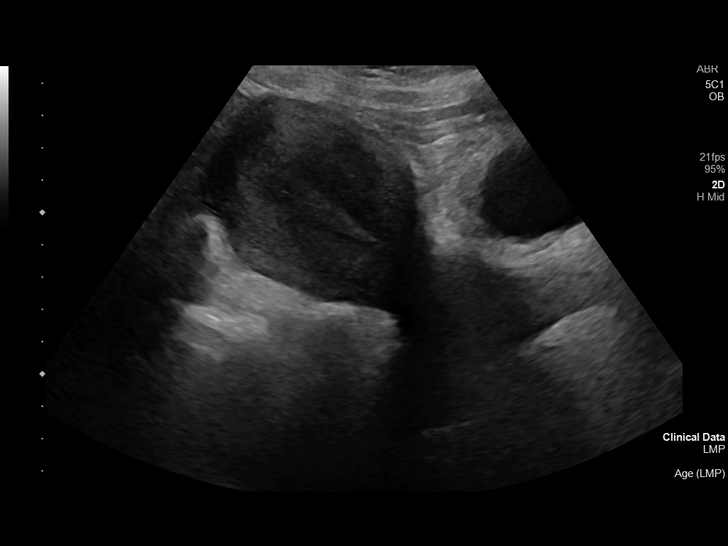
[im 16/82]
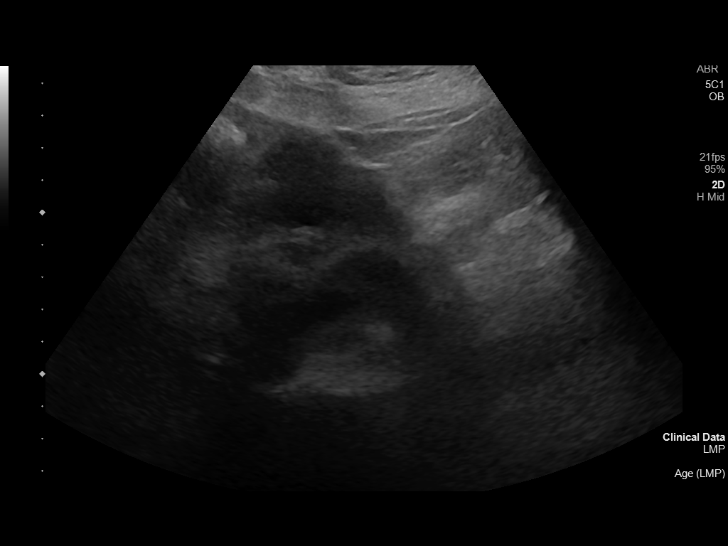
[im 22/82]
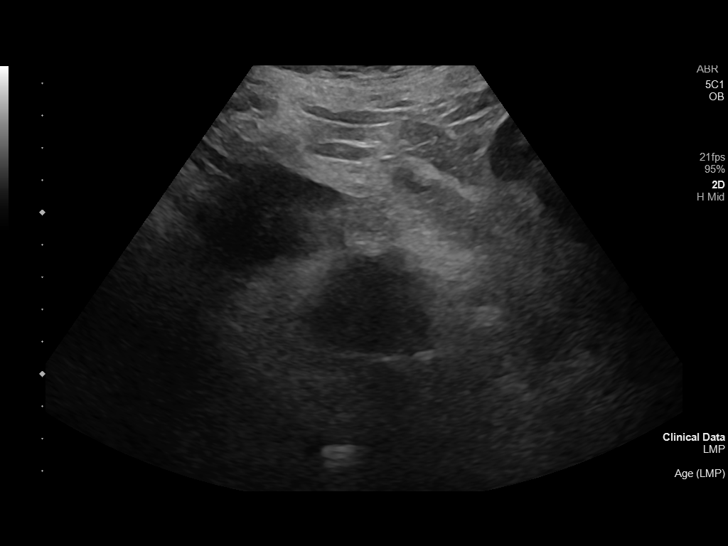
[im 28/82]
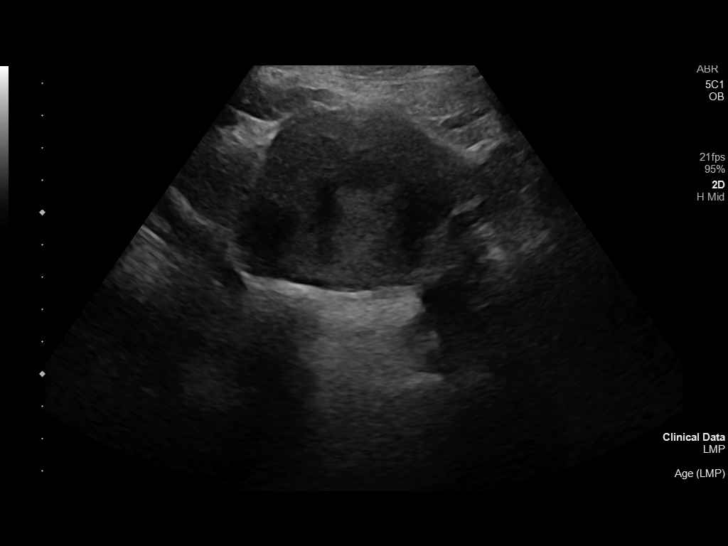
[im 34/82]
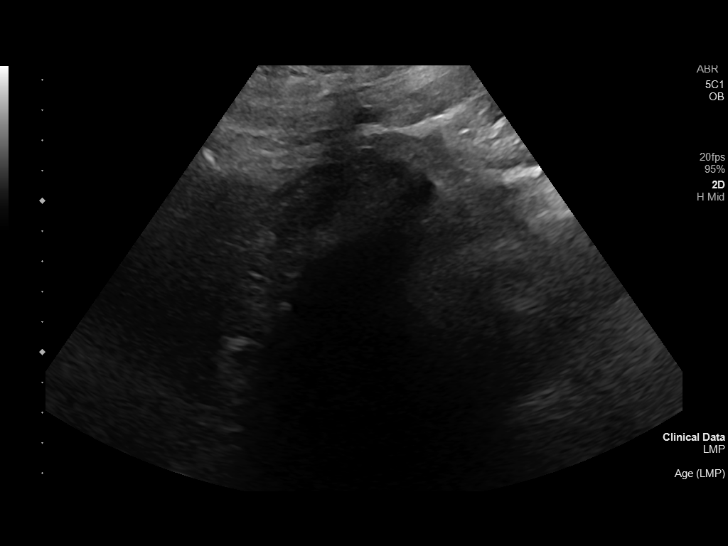
[im 40/82]
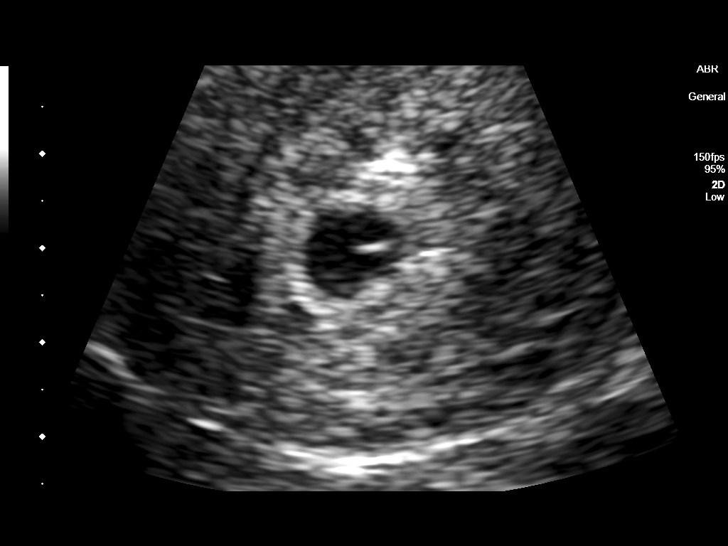
[im 46/82]
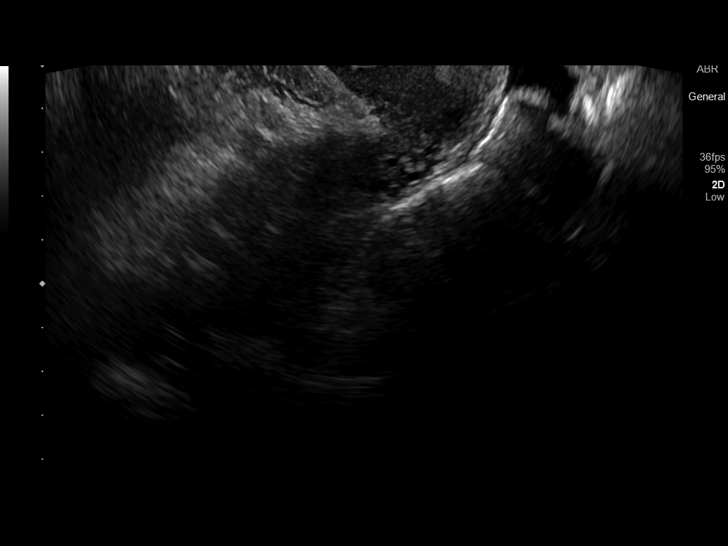
[im 52/82]
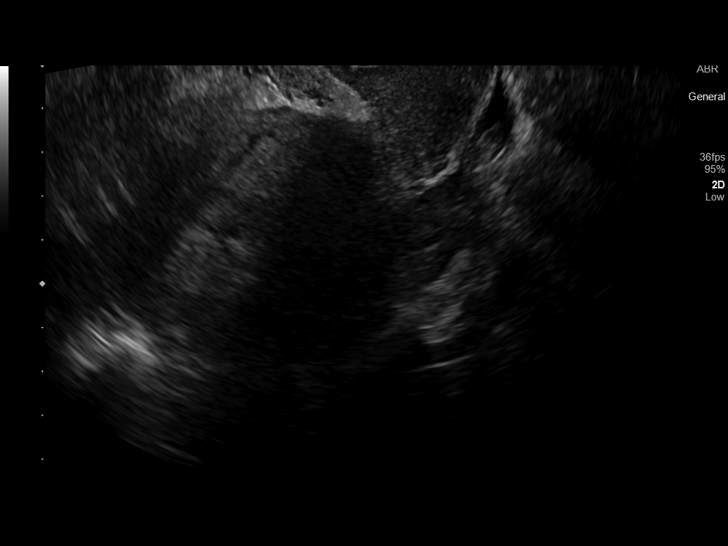
[im 58/82]
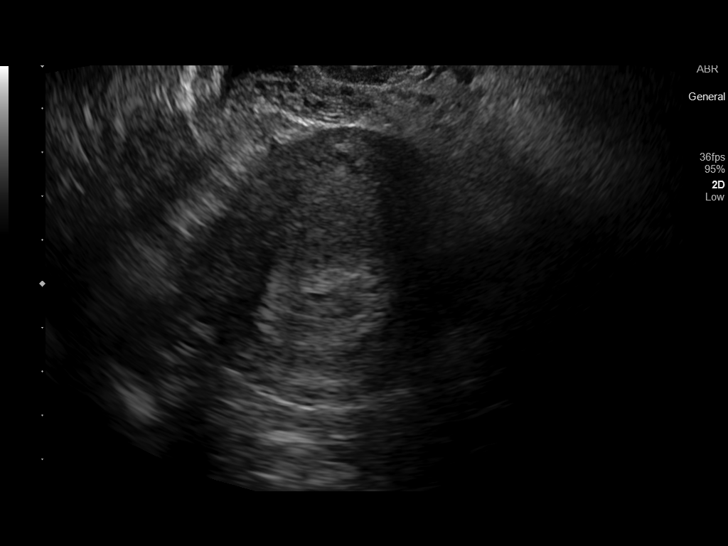
[im 64/82]
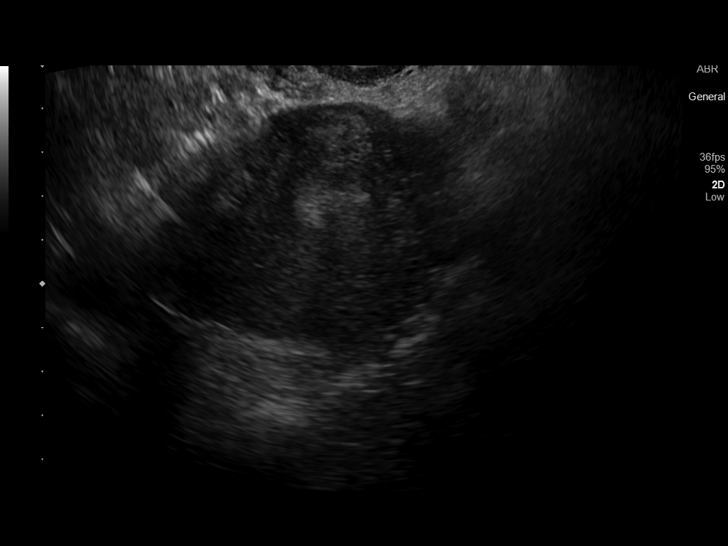
[im 70/82]
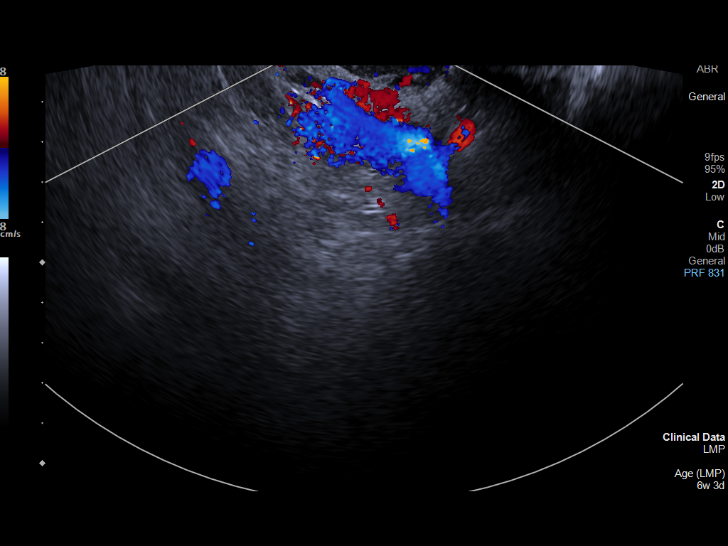
[im 76/82]
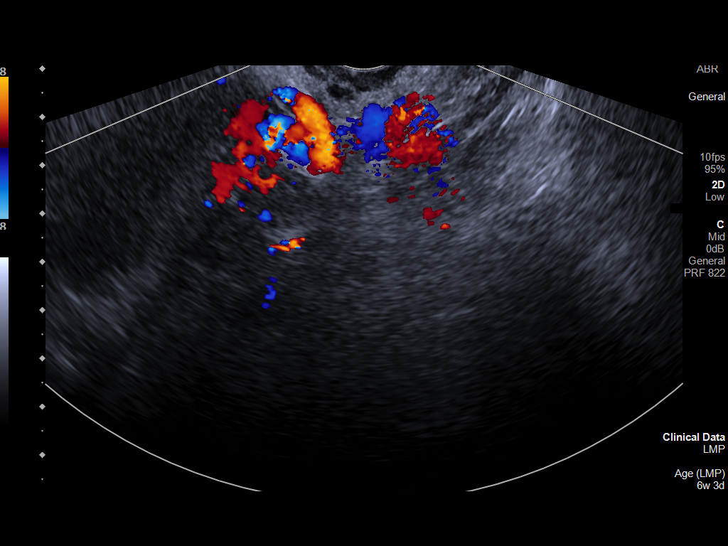
[im 82/82]
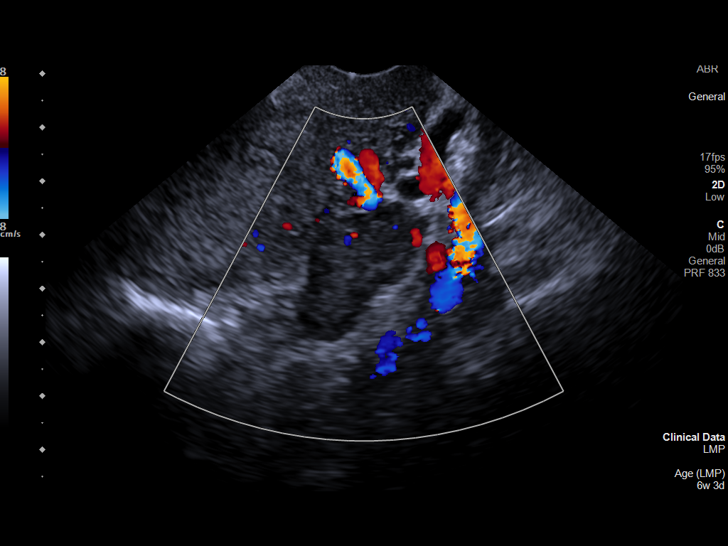

[14 of 28 positions shown; findings below may reference images not displayed]

FINDINGS: Intrauterine gestational sac: Single

Yolk sac:  Not Visualized.

Embryo:  Visualized.

Cardiac Activity: Visualized.

Heart Rate: 116 bpm

CRL:  4.8 mm   6 w   1 d                  US EDC: 11/16/2022

Subchorionic hemorrhage:  None visualized.

Maternal uterus/adnexae: Unremarkable left ovary. Right ovary was
not visualized. No free fluid within the pelvis.
IMPRESSION: 1. Single live intrauterine gestation measuring 6 weeks 1 day by
crown-rump length.
2. Active heart tones at 116 BPM.

## 2024-02-20 IMAGING — US US OB COMP LESS 14 WK
1 series · 14 of 28 positions shown · non-contrast
Comparison: Sonogram dated March 24, 2022

CLINICAL DATA: Vaginal bleeding.

EXAM:
OBSTETRIC <14 WK US AND TRANSVAGINAL OB US
TECHNIQUE: Both transabdominal and transvaginal ultrasound examinations were
performed for complete evaluation of the gestation as well as the
maternal uterus, adnexal regions, and pelvic cul-de-sac.
Transvaginal technique was performed to assess early pregnancy.

[Series 1: us ob comp less 14 wks · 14 of 36 slices shown]
[im 2/36]
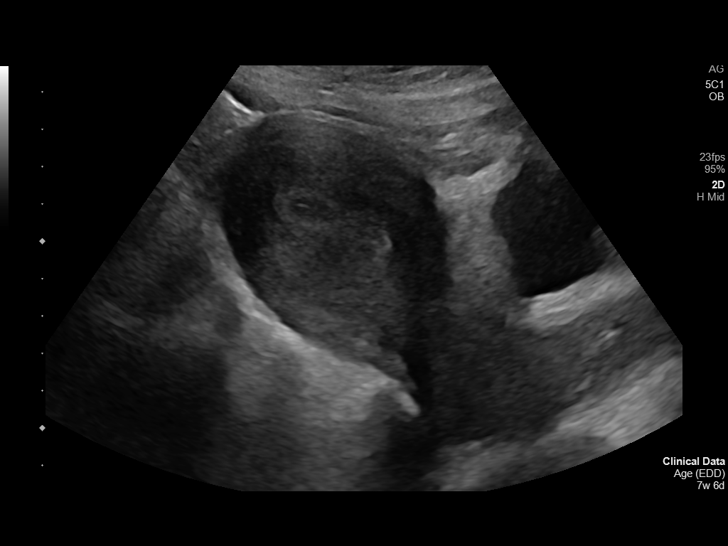
[im 4/36]
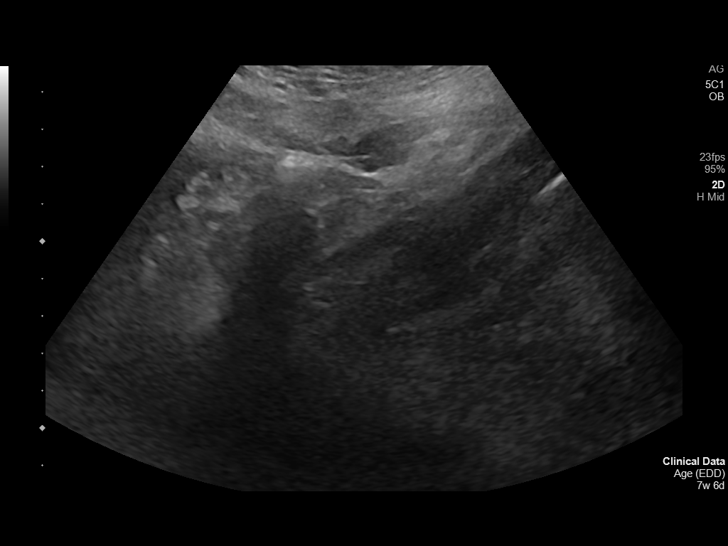
[im 7/36]
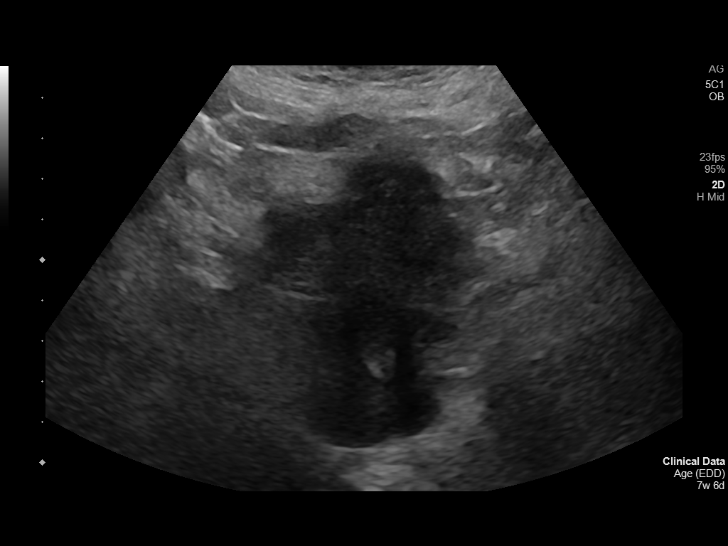
[im 10/36]
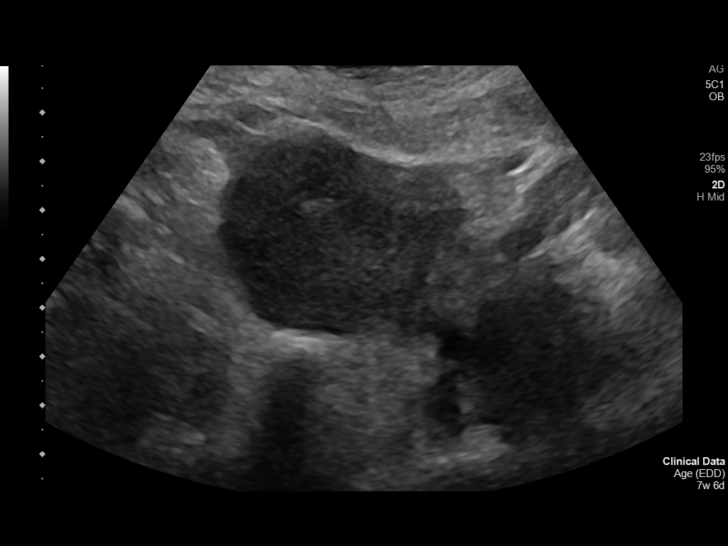
[im 12/36]
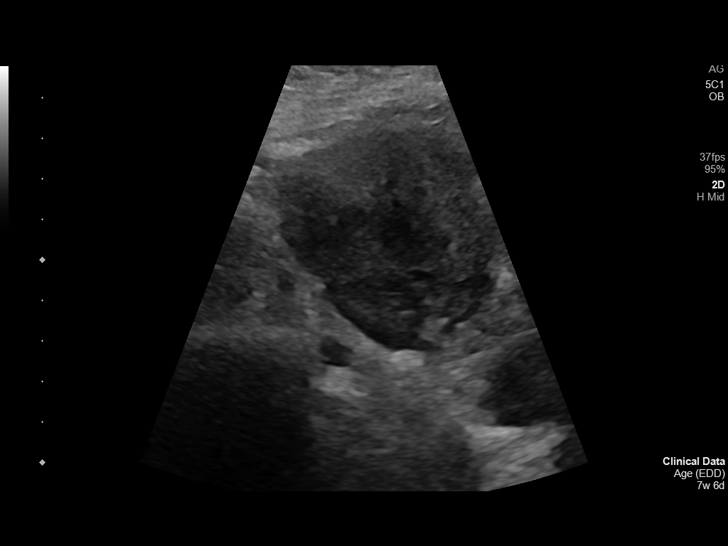
[im 15/36]
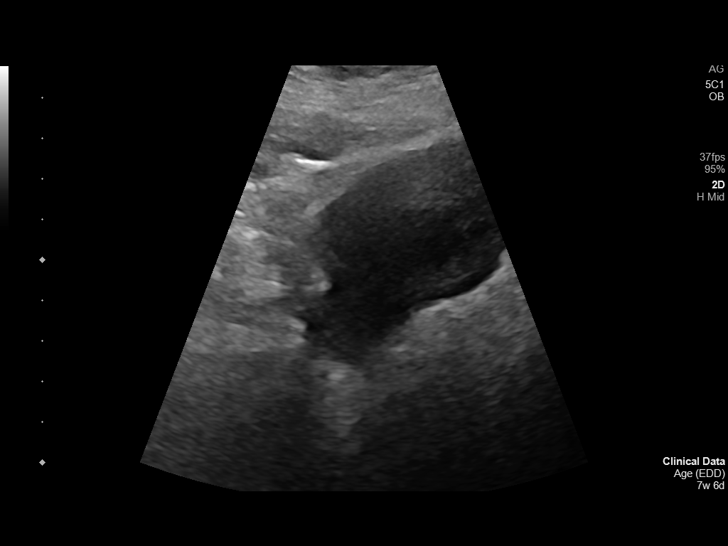
[im 17/36]
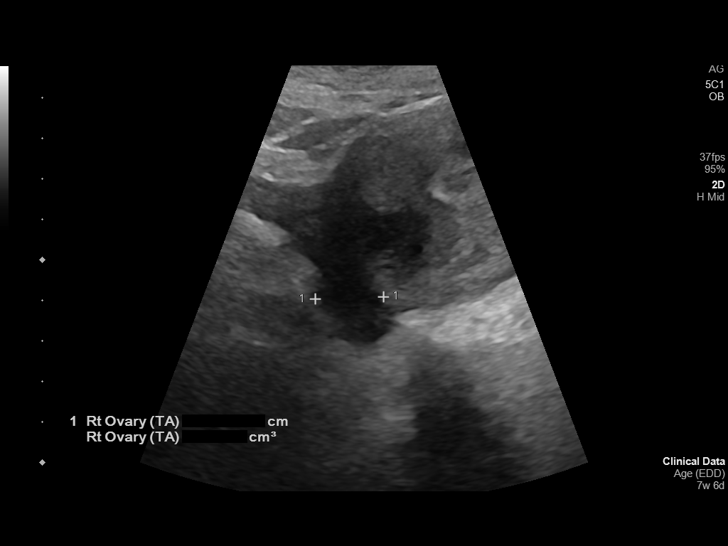
[im 20/36]
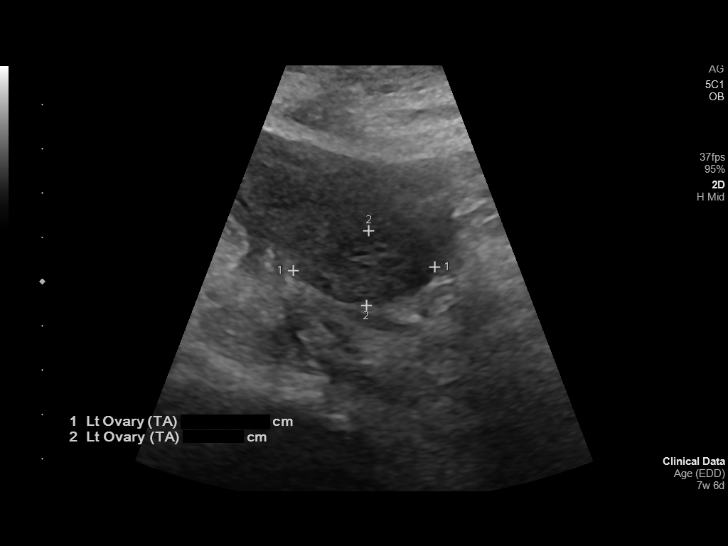
[im 23/36]
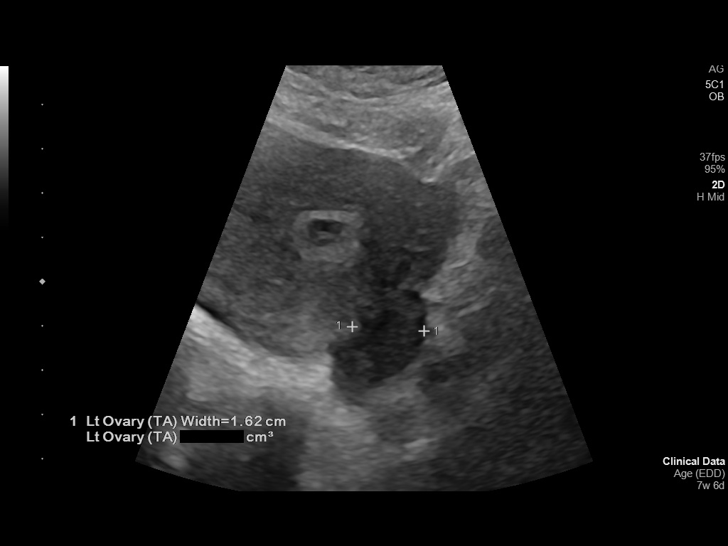
[im 25/36]
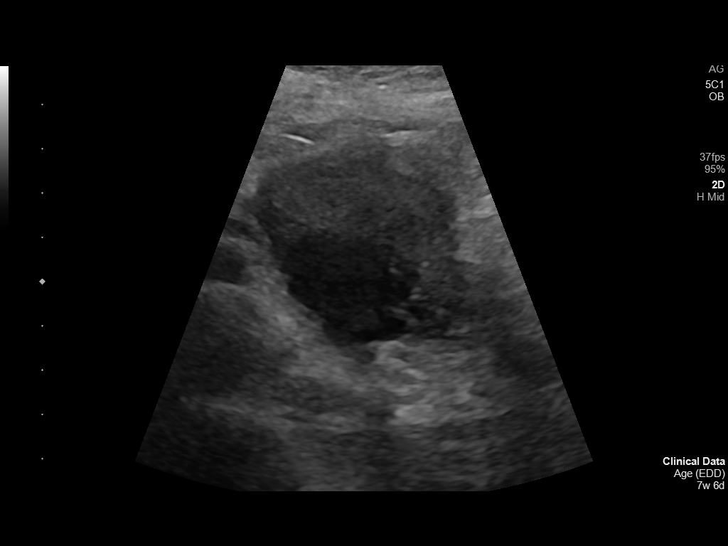
[im 28/36]
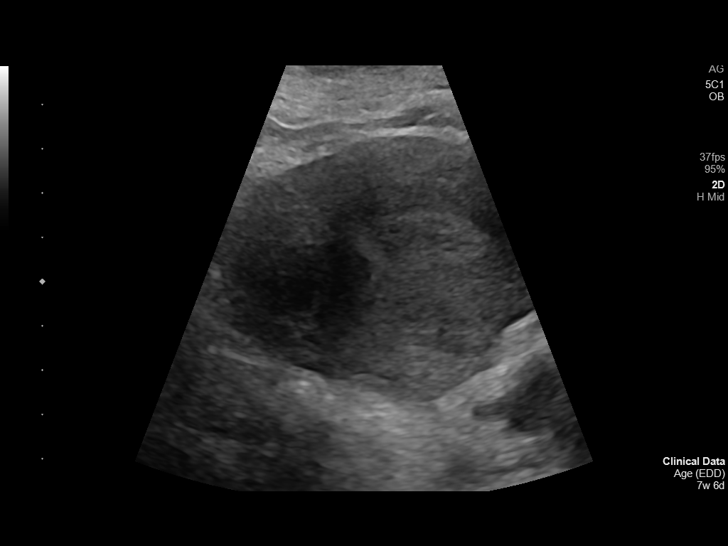
[im 30/36]
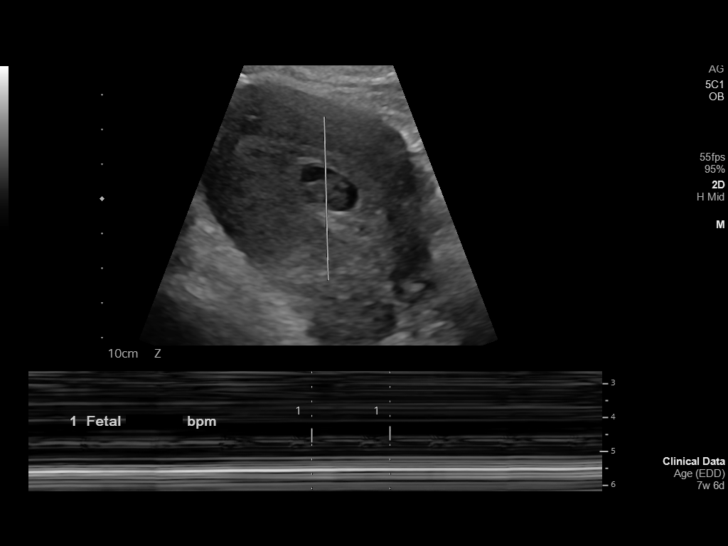
[im 33/36]
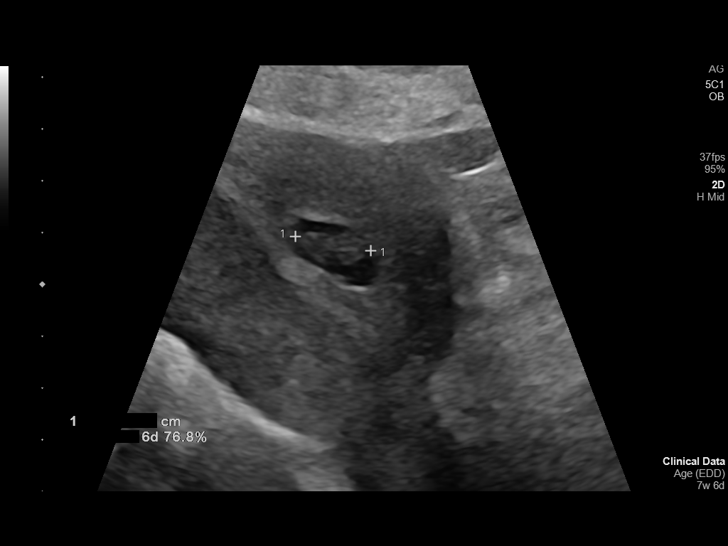
[im 36/36]
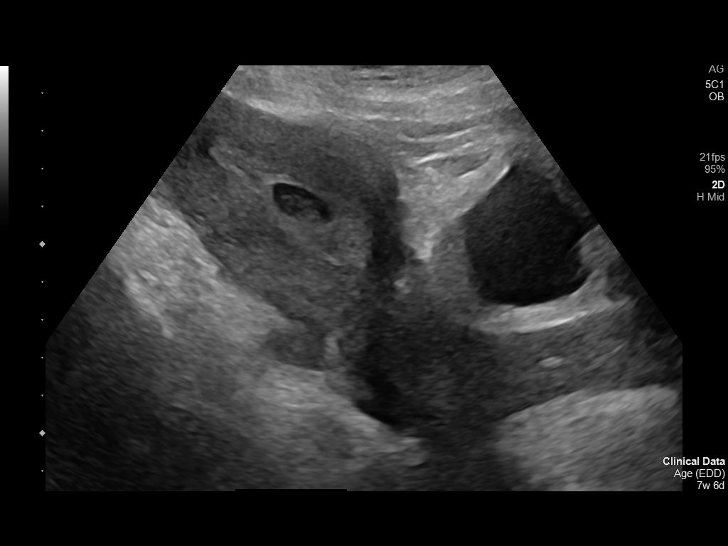

[14 of 28 positions shown; findings below may reference images not displayed]

FINDINGS: Intrauterine gestational sac: Single

Yolk sac:  Visualized.

Embryo:  Visualized.

Cardiac Activity: Visualized.

Heart Rate: 131 bpm

CRL:  15 mm   7 w   6 d                  US EDC: 11/16/2022

Subchorionic hemorrhage:  None visualized.

Maternal uterus/adnexae: Ovaries are unremarkable. Right uterine
wall fibroid measuring 2.8 x 2.4 x 3.0 cm.
IMPRESSION: 1. Single live intrauterine gestation with approximate gestational
age of 7 weeks and 6 days.
2. No appreciable subchorionic hemorrhage.
3. Right uterine wall fibroid measuring 2.8 x 2.4 x 3.0 cm. Six
weeks 6

## 2024-11-09 ENCOUNTER — Ambulatory Visit

## 2024-11-29 ENCOUNTER — Encounter
